# Patient Record
Sex: Female | Born: 2000 | Race: White | Hispanic: No | Marital: Single | Smoking: Never smoker
Health system: Southern US, Community
[De-identification: ages and names within clinical notes are randomized; demographics above are authoritative.]

## PROBLEM LIST (undated history)

## (undated) DIAGNOSIS — G43909 Migraine, unspecified, not intractable, without status migrainosus: Secondary | ICD-10-CM

## (undated) DIAGNOSIS — F329 Major depressive disorder, single episode, unspecified: Secondary | ICD-10-CM

## (undated) DIAGNOSIS — R7303 Prediabetes: Secondary | ICD-10-CM

## (undated) DIAGNOSIS — J45909 Unspecified asthma, uncomplicated: Secondary | ICD-10-CM

## (undated) DIAGNOSIS — E119 Type 2 diabetes mellitus without complications: Secondary | ICD-10-CM

## (undated) DIAGNOSIS — E669 Obesity, unspecified: Secondary | ICD-10-CM

## (undated) DIAGNOSIS — F32A Depression, unspecified: Secondary | ICD-10-CM

## (undated) HISTORY — DX: Major depressive disorder, single episode, unspecified: F32.9

## (undated) HISTORY — PX: NO PAST SURGERIES: SHX2092

## (undated) HISTORY — DX: Depression, unspecified: F32.A

## (undated) HISTORY — DX: Prediabetes: R73.03

---
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
## 2021-01-24 ENCOUNTER — Emergency Department
Admission: EM | Admit: 2021-01-24 | Discharge: 2021-01-24 | Disposition: A | Discharge status: Home / Self Care | Payer: BC Managed Care – PPO | Attending: Emergency Medicine | Admitting: Emergency Medicine

## 2021-01-24 ENCOUNTER — Emergency Department: Payer: BC Managed Care – PPO

## 2021-01-24 ENCOUNTER — Other Ambulatory Visit: Payer: Self-pay

## 2021-01-24 ENCOUNTER — Encounter: Payer: Self-pay | Admitting: Emergency Medicine

## 2021-01-24 DIAGNOSIS — Z7984 Long term (current) use of oral hypoglycemic drugs: Secondary | ICD-10-CM | POA: Insufficient documentation

## 2021-01-24 DIAGNOSIS — E119 Type 2 diabetes mellitus without complications: Secondary | ICD-10-CM | POA: Diagnosis not present

## 2021-01-24 DIAGNOSIS — Z7951 Long term (current) use of inhaled steroids: Secondary | ICD-10-CM | POA: Diagnosis not present

## 2021-01-24 DIAGNOSIS — I10 Essential (primary) hypertension: Secondary | ICD-10-CM | POA: Insufficient documentation

## 2021-01-24 DIAGNOSIS — J4551 Severe persistent asthma with (acute) exacerbation: Secondary | ICD-10-CM | POA: Diagnosis not present

## 2021-01-24 DIAGNOSIS — R531 Weakness: Secondary | ICD-10-CM | POA: Insufficient documentation

## 2021-01-24 DIAGNOSIS — T7840XA Allergy, unspecified, initial encounter: Secondary | ICD-10-CM | POA: Insufficient documentation

## 2021-01-24 LAB — URINALYSIS, COMPLETE (UACMP) WITH MICROSCOPIC
Bacteria, UA: NONE SEEN
Bilirubin Urine: NEGATIVE
Glucose, UA: NEGATIVE mg/dL
Hgb urine dipstick: NEGATIVE
Ketones, ur: NEGATIVE mg/dL
Leukocytes,Ua: NEGATIVE
Nitrite: NEGATIVE
Protein, ur: NEGATIVE mg/dL
Specific Gravity, Urine: 1.019 (ref 1.005–1.030)
pH: 6 (ref 5.0–8.0)

## 2021-01-24 LAB — COMPREHENSIVE METABOLIC PANEL
ALT: 23 U/L (ref 0–44)
AST: 15 U/L (ref 15–41)
Albumin: 3.5 g/dL (ref 3.5–5.0)
Alkaline Phosphatase: 55 U/L (ref 38–126)
Anion gap: 7 (ref 5–15)
BUN: 13 mg/dL (ref 6–20)
CO2: 24 mmol/L (ref 22–32)
Calcium: 9 mg/dL (ref 8.9–10.3)
Chloride: 109 mmol/L (ref 98–111)
Creatinine, Ser: 0.63 mg/dL (ref 0.44–1.00)
GFR, Estimated: >60 mL/min (ref >60)
Glucose, Bld: 85 mg/dL (ref 70–99)
Potassium: 4.2 mmol/L (ref 3.5–5.1)
Sodium: 140 mmol/L (ref 135–145)
Total Bilirubin: 0.5 mg/dL (ref 0.3–1.2)
Total Protein: 7.9 g/dL (ref 6.5–8.1)

## 2021-01-24 LAB — CBC WITH DIFFERENTIAL/PLATELET
Abs Immature Granulocytes: 0.04 10*3/uL (ref 0.00–0.07)
Basophils Absolute: 0 10*3/uL (ref 0.0–0.1)
Basophils Relative: 0 %
Eosinophils Absolute: 0.1 10*3/uL (ref 0.0–0.5)
Eosinophils Relative: 1 %
HCT: 30.8 % — ABNORMAL LOW (ref 36.0–46.0)
Hemoglobin: 9 g/dL — ABNORMAL LOW (ref 12.0–15.0)
Immature Granulocytes: 0 %
Lymphocytes Relative: 21 %
Lymphs Abs: 1.9 10*3/uL (ref 0.7–4.0)
MCH: 24.2 pg — ABNORMAL LOW (ref 26.0–34.0)
MCHC: 29.2 g/dL — ABNORMAL LOW (ref 30.0–36.0)
MCV: 82.8 fL (ref 80.0–100.0)
Monocytes Absolute: 0.6 10*3/uL (ref 0.1–1.0)
Monocytes Relative: 6 %
Neutro Abs: 6.4 10*3/uL (ref 1.7–7.7)
Neutrophils Relative %: 72 %
Platelets: 278 10*3/uL (ref 150–400)
RBC: 3.72 MIL/uL — ABNORMAL LOW (ref 3.87–5.11)
RDW: 16.9 % — ABNORMAL HIGH (ref 11.5–15.5)
WBC: 9 10*3/uL (ref 4.0–10.5)
nRBC: 0 % (ref 0.0–0.2)

## 2021-01-24 MED ORDER — PREDNISONE 50 MG PO TABS: 50.0000 mg | ORAL_TABLET | Freq: Every day | ORAL | 0 refills | Status: DC

## 2021-01-24 MED ORDER — PREDNISONE 20 MG PO TABS
60.0000 mg | ORAL_TABLET | Freq: Once | ORAL | Status: AC
  Administered 2021-01-24: 60 mg via ORAL
  Filled 2021-01-24: qty 3

## 2021-01-24 NOTE — ED Triage Notes (Signed)
Pt comes into the ED via POV c/o allergic reaction. Pt states she ate a [employer] last night and her throat start getting "itchy and swollen".  Pt called EMS who administered an Epi pen.  Pt states this happened at 1:00 this morning.  Pt went to bed and woke up still not feeling well.  Pt denies any swelling of the throat or itchiness now, just states "I don't feel good overall".  Pt denies any CP, SHOB, dizziness, N/V.

## 2021-01-24 NOTE — ED Provider Notes (Signed)
[facility] Emergency Department Provider Note  ____________________________________________  Time seen: Approximately 4:16 PM  I have reviewed the triage vital signs and the nursing notes.   HISTORY  Chief Complaint Allergic Reaction    HPI Sabrina Bell is a 20 y.o. female who presents emergency department for evaluation of "not feeling well" since this morning.  Patient states that last night she was eating a "Hershey kiss" and started having swelling and difficulty breathing/swallowing.  911 was called and EMS presented to evaluate the patient.  The patient reports that she received an EpiPen but did not receive transport evaluation in the emergency department.  Patient states that she felt better after the EpiPen, went to bed when she woke up she has felt "off."  Patient is unable to exactly describe what she means.  No difficulty breathing or swallowing, no rashes, no GI upset, no fevers or chills, nasal congestion, sore throat, cough, abdominal pain.  No urinary symptoms.  Patient does have a history of asthma, diabetes, anemia, hypertension, major depressive disorder, general anxiety.  No additional medications today for her complaint prior to arrival.         Past Medical History:  Diagnosis Date  . Asthma   . Borderline diabetes   . Depression   . Diabetes mellitus without complication ([place])   . Migraines   . Obesity     Patient Active Problem List   Diagnosis Date Noted  . Iron deficiency 05/17/2020  . Chronic iron deficiency anemia 02/03/2020  . Prediabetes 02/03/2020  . Asthma exacerbation 02/03/2020  . 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 Asthma 08/13/2019  . Abnormal abdominal ultrasound 08/13/2019  . Menorrhagia 08/10/2019  . Abdominal pain 08/10/2019  . Status asthmaticus 11/05/2018  . Amenorrhea 11/05/2018  . Social isolation 11/05/2018  . Educational circumstances 11/05/2018  . OSA  (obstructive sleep apnea) 05/27/2017  . Hypersomnia   . Morbid obesity with BMI of 70 and over, adult ([place])   . Essential hypertension   . Acanthosis nigricans, acquired   . Dyspepsia   . Anemia   . Adjustment reaction with anxiety and depression   . Acute respiratory failure with hypoxia ([place])   . Insomnia 11/29/2015  . Generalized anxiety disorder 11/29/2015  . MDD (major depressive disorder), recurrent episode, severe ([place]) 11/28/2015    History reviewed. No pertinent surgical history.  Prior to Admission medications   Medication Sig Start Date End Date Taking? Authorizing Provider  ADVAIR DISKUS 250-50 MCG/DOSE AEPB Inhale 1 puff into the lungs 2 (two) times daily. 03/01/20   [provider]  albuterol (PROVENTIL HFA;VENTOLIN HFA) 108 (90 Base) MCG/ACT inhaler Inhale 1-2 puffs into the lungs every 6 (six) hours as needed for wheezing or shortness of breath. 11/06/18   Wilber Oliphant, MD  cetirizine (ZYRTEC ALLERGY) 10 MG tablet Take 1 tablet (10 mg total) by mouth daily for 5 days. 04/28/20 05/30/20  Rudene Re, MD  EPINEPHrine 0.3 mg/0.3 mL IJ SOAJ injection Inject 0.3 mLs (0.3 mg total) into the muscle as needed for anaphylaxis. 04/28/20   Rudene Re, MD  famotidine (PEPCID) 20 MG tablet Take 1 tablet (20 mg total) by mouth daily for 5 days. 04/28/20 05/03/20  Rudene Re, MD  ferrous sulfate 325 (65 FE) MG tablet Take 1 tablet (325 mg total) by mouth 2 (two) times daily with a meal. 08/11/19 05/30/20  Burnis Medin, MD  fluticasone (FLONASE) 50 MCG/ACT nasal spray Place 2 sprays into both  nostrils daily. Patient not taking: Reported on 09/22/2020 05/30/20   Melynda Ripple, MD  Fluticasone-Salmeterol (ADVAIR DISKUS) 250-50 MCG/DOSE AEPB  03/01/20   [provider]  medroxyPROGESTERone (PROVERA) 10 MG tablet Take 2 tablets (20mg ) twice daily (morning and evening) per day until bleeding stops Then take 2 tablets (20mg ) in the morning and 1 tablet (10mg ) in  the evening for 4 days Then 1 tablet (10mg ) in the morning and 1 tablet (10mg ) in the evening for 4 days Then 1 tablet (10mg ) daily for 4 days Then 1/2 tablet (5mg ) daily for 4 days Then stop 01/25/20   [provider]  metFORMIN (GLUCOPHAGE) 500 MG tablet Take 500 mg by mouth 2 (two) times daily. 12/27/19   [provider]  Spacer/Aero-Holding Chambers (AEROCHAMBER PLUS) inhaler Use as instructed 05/30/20   Melynda Ripple, MD  Thiamine HCl (VITAMIN B-1) 250 MG tablet Take 250 mg by mouth daily. Patient not taking: Reported on 09/22/2020    [provider]  fluticasone (FLOVENT HFA) 110 MCG/ACT inhaler Inhale 2 puffs into the lungs 2 (two) times daily. 11/06/18 05/30/20  Wilber Oliphant, MD    Allergies Cashew nut oil, Omnicef [cefdinir], Other, and Watermelon flavor [flavoring agent]  Family History  Problem Relation Age of Onset  . Diabetes Mother   . Vision loss Mother   . Ovarian cancer Paternal Grandmother   . Prostate cancer Paternal Grandfather     Social History Social History   Tobacco Use  . Smoking status: Never Smoker  . Smokeless tobacco: Never Used  Vaping Use  . Vaping Use: Never used  Substance Use Topics  . Alcohol use: No  . Drug use: No     Review of Systems  Constitutional: No fever/chills.  Positive for generalized malaise.  Treated for allergic reaction yesterday Eyes: No visual changes. No discharge ENT: No upper respiratory complaints. Cardiovascular: no chest pain. Respiratory: no cough. No SOB. Gastrointestinal: No abdominal pain.  No nausea, no vomiting.  No diarrhea.  No constipation. Musculoskeletal: Negative for musculoskeletal pain. Skin: Negative for rash, abrasions, lacerations, ecchymosis. Neurological: Negative for headaches, focal weakness or numbness.  10 System ROS otherwise negative.  ____________________________________________   PHYSICAL EXAM:  VITAL SIGNS: ED Triage Vitals [01/24/21 1501]  Enc Vitals  Group     BP 122/78     Pulse Rate 89     Resp 17     Temp 98.3 F (36.8 C)     Temp Source Oral     SpO2 97 %     Weight (!) 430 lb (195 kg)     Height 5\' 7"  (1.702 m)     Head Circumference      Peak Flow      Pain Score 0     Pain Loc      Pain Edu?      Excl. in [place]?      Constitutional: Alert and oriented. Well appearing and in no acute distress. Eyes: Conjunctivae are normal. PERRL. EOMI. Head: Atraumatic. ENT:      Ears:       Nose: No congestion/rhinnorhea.      Mouth/Throat: Mucous membranes are moist.  No oropharyngeal erythema or edema identified.  Uvula midline. Neck: No stridor.   Hematological/Lymphatic/Immunilogical: No cervical lymphadenopathy. Cardiovascular: Normal rate, regular rhythm. Normal S1 and S2.  Good peripheral circulation. Respiratory: Normal respiratory effort without tachypnea or retractions. Lungs CTAB. Good air entry to the bases with no decreased or absent breath sounds. Gastrointestinal:  Bowel sounds 4 quadrants. Soft and nontender to palpation. No guarding or rigidity. No palpable masses. No distention. No CVA tenderness. Musculoskeletal: Full range of motion to all extremities. No gross deformities appreciated. Neurologic:  Normal speech and language. No gross focal neurologic deficits are appreciated.  Skin:  Skin is warm, dry and intact. No rash noted. Psychiatric: Mood and affect are normal. Speech and behavior are normal. Patient exhibits appropriate insight and judgement.   ____________________________________________   LABS (all labs ordered are listed, but only abnormal results are displayed)  Labs Reviewed  CBC WITH DIFFERENTIAL/PLATELET - Abnormal; Notable for the following components:      Result Value   RBC 3.72 (*)    Hemoglobin 9.0 (*)    HCT 30.8 (*)    MCH 24.2 (*)    MCHC 29.2 (*)    RDW 16.9 (*)    All other components within normal limits  URINALYSIS, COMPLETE (UACMP) WITH MICROSCOPIC - Abnormal; Notable for  the following components:   Color, Urine YELLOW (*)    APPearance CLEAR (*)    All other components within normal limits  COMPREHENSIVE METABOLIC PANEL   ____________________________________________  EKG   ____________________________________________  RADIOLOGY I personally viewed and evaluated these images as part of my medical decision making, as well as reviewing the written report by the radiologist.  ED Provider Interpretation: No acute cardiopulmonary abnormality on x-ray.  DG Chest 2 View  Result Date: 01/24/2021 CLINICAL DATA:  Weakness EXAM: CHEST - 2 VIEW COMPARISON:  None. FINDINGS: The heart size and mediastinal contours are within normal limits. Both lungs are clear. The visualized skeletal structures are unremarkable. IMPRESSION: No active cardiopulmonary disease. Electronically Signed   By: Prudencio Pair M.D.   On: 01/24/2021 17:15    ____________________________________________    PROCEDURES  Procedure(s) performed:    Procedures    Medications  predniSONE (DELTASONE) tablet 60 mg (has no administration in time range)     ____________________________________________   INITIAL IMPRESSION / ASSESSMENT AND PLAN / ED COURSE  Pertinent labs & imaging results that were available during my care of the patient were reviewed by me and considered in my medical decision making (see chart for details).  Review of the [place] CSRS was performed in accordance of the [place] prior to dispensing any controlled drugs.           Patient's diagnosis is consistent with weakness.  Patient presented to the emergency department complaining of not feeling well.  Patient had reported allergic reaction last night.  EMS was called and patient reportedly received an EpiPen but was not transported or assessed in the emergency department.  Patient states that she woke up feeling "off."  No acute findings on physical exam.  Labs, chest x-ray, urine is reassuring at this time.  I will  treat the patient with prednisone secondary to her allergic reaction requiring epi earlier today, however no other acute findings requiring intervention currently.  If patient develops further symptoms and is able to better qualify what is wrong, she can always return to the emergency department for evaluation.  Return precautions discussed at length with the patient.  Follow-up primary care. Patient is given ED precautions to return to the ED for any worsening or new symptoms.     ____________________________________________  FINAL CLINICAL IMPRESSION(S) / ED DIAGNOSES  Final diagnoses:  Weakness      NEW MEDICATIONS STARTED DURING THIS VISIT:  ED Discharge Orders    None  This chart was dictated using voice recognition software/Dragon. Despite best efforts to proofread, errors can occur which can change the meaning. Any change was purely unintentional.    Darletta Moll, PA-C 01/24/21 2120    Lucrezia Starch, MD 01/24/21 [phone]

## 2021-01-24 NOTE — ED Notes (Signed)
Pt calm, collective, denies pain or sob

## 2021-01-26 ENCOUNTER — Other Ambulatory Visit: Payer: Self-pay

## 2021-01-26 DIAGNOSIS — H5316 Psychophysical visual disturbances: Secondary | ICD-10-CM | POA: Diagnosis not present

## 2021-01-26 DIAGNOSIS — E1169 Type 2 diabetes mellitus with other specified complication: Secondary | ICD-10-CM | POA: Diagnosis not present

## 2021-01-26 DIAGNOSIS — Z7951 Long term (current) use of inhaled steroids: Secondary | ICD-10-CM | POA: Insufficient documentation

## 2021-01-26 DIAGNOSIS — Z7984 Long term (current) use of oral hypoglycemic drugs: Secondary | ICD-10-CM | POA: Diagnosis not present

## 2021-01-26 DIAGNOSIS — I10 Essential (primary) hypertension: Secondary | ICD-10-CM | POA: Insufficient documentation

## 2021-01-26 DIAGNOSIS — J45909 Unspecified asthma, uncomplicated: Secondary | ICD-10-CM | POA: Insufficient documentation

## 2021-01-26 DIAGNOSIS — R5381 Other malaise: Secondary | ICD-10-CM | POA: Diagnosis not present

## 2021-01-26 DIAGNOSIS — G2402 Drug induced acute dystonia: Secondary | ICD-10-CM | POA: Insufficient documentation

## 2021-01-26 DIAGNOSIS — R253 Fasciculation: Secondary | ICD-10-CM | POA: Diagnosis not present

## 2021-01-26 NOTE — ED Triage Notes (Signed)
Patient presents with EMS reporting acute onset of R sided twitching to neck. Hx of anxiety. Denies pain or feeling anxious. Pt calm and cooperative in triage but twitching is noticed. Pt denies head or neurological trauma. Denies hx of seizures. Denies changes to medications or diet. Breathing unlabored speaking in full sentences.

## 2021-01-27 ENCOUNTER — Emergency Department
Admission: EM | Admit: 2021-01-27 | Discharge: 2021-01-27 | Disposition: A | Discharge status: Home / Self Care | Payer: BC Managed Care – PPO | Attending: Emergency Medicine | Admitting: Emergency Medicine

## 2021-01-27 DIAGNOSIS — G2402 Drug induced acute dystonia: Secondary | ICD-10-CM

## 2021-01-27 LAB — CBC WITH DIFFERENTIAL/PLATELET
Abs Immature Granulocytes: 0.04 10*3/uL (ref 0.00–0.07)
Basophils Absolute: 0.1 10*3/uL (ref 0.0–0.1)
Basophils Relative: 1 %
Eosinophils Absolute: 0 10*3/uL (ref 0.0–0.5)
Eosinophils Relative: 0 %
HCT: 33 % — ABNORMAL LOW (ref 36.0–46.0)
Hemoglobin: 10 g/dL — ABNORMAL LOW (ref 12.0–15.0)
Immature Granulocytes: 0 %
Lymphocytes Relative: 28 %
Lymphs Abs: 3.7 10*3/uL (ref 0.7–4.0)
MCH: 24.8 pg — ABNORMAL LOW (ref 26.0–34.0)
MCHC: 30.3 g/dL (ref 30.0–36.0)
MCV: 81.9 fL (ref 80.0–100.0)
Monocytes Absolute: 0.8 10*3/uL (ref 0.1–1.0)
Monocytes Relative: 6 %
Neutro Abs: 8.4 10*3/uL — ABNORMAL HIGH (ref 1.7–7.7)
Neutrophils Relative %: 65 %
Platelets: 298 10*3/uL (ref 150–400)
RBC: 4.03 MIL/uL (ref 3.87–5.11)
RDW: 16.6 % — ABNORMAL HIGH (ref 11.5–15.5)
WBC: 13 10*3/uL — ABNORMAL HIGH (ref 4.0–10.5)
nRBC: 0 % (ref 0.0–0.2)

## 2021-01-27 LAB — COMPREHENSIVE METABOLIC PANEL
ALT: 23 U/L (ref 0–44)
AST: 18 U/L (ref 15–41)
Albumin: 3.8 g/dL (ref 3.5–5.0)
Alkaline Phosphatase: 50 U/L (ref 38–126)
Anion gap: 10 (ref 5–15)
BUN: 22 mg/dL — ABNORMAL HIGH (ref 6–20)
CO2: 23 mmol/L (ref 22–32)
Calcium: 9.4 mg/dL (ref 8.9–10.3)
Chloride: 106 mmol/L (ref 98–111)
Creatinine, Ser: 0.98 mg/dL (ref 0.44–1.00)
GFR, Estimated: >60 mL/min (ref >60)
Glucose, Bld: 94 mg/dL (ref 70–99)
Potassium: 3.8 mmol/L (ref 3.5–5.1)
Sodium: 139 mmol/L (ref 135–145)
Total Bilirubin: 0.6 mg/dL (ref 0.3–1.2)
Total Protein: 8.4 g/dL — ABNORMAL HIGH (ref 6.5–8.1)

## 2021-01-27 LAB — MAGNESIUM: Magnesium: 1.7 mg/dL (ref 1.7–2.4)

## 2021-01-27 LAB — CK: Total CK: 66 U/L (ref 38–234)

## 2021-01-27 MED ORDER — DIPHENHYDRAMINE HCL 50 MG/ML IJ SOLN
25.0000 mg | Freq: Once | INTRAMUSCULAR | Status: AC
  Administered 2021-01-27: 25 mg via INTRAVENOUS
  Filled 2021-01-27: qty 1

## 2021-01-27 MED ORDER — SODIUM CHLORIDE 0.9 % IV BOLUS
500.0000 mL | Freq: Once | INTRAVENOUS | Status: AC
  Administered 2021-01-27: 500 mL via INTRAVENOUS

## 2021-01-27 NOTE — Discharge Instructions (Addendum)
Discontinue Prednisone.  Talk to your doctor if you should continue Prozac.  Take Benadryl as needed for muscle twitching.  Return to the ER for worsening symptoms, persistent vomiting, difficulty breathing or other concerns.

## 2021-01-27 NOTE — ED Provider Notes (Signed)
[facility] Emergency Department Provider Note   ____________________________________________   Event Date/Time   First MD Initiated Contact with Patient 01/27/21 [phone]     (approximate)  I have reviewed the triage vital signs and the nursing notes.   HISTORY  Chief Complaint muscle twitching    HPI Sabrina Bell is a 20 y.o. female brought to the ED via EMS from home with a chief complaint of neck twitching.  Patient has a history of depression, on Prozac.  Recently seen in the ED 3 days ago after allergic reaction and placed on prednisone.  Reports involuntary neck twitching on and off since yesterday.  Denies pain.  Denies headache, vision changes, neck pain, chest pain, shortness of breath, abdominal pain, nausea, vomiting, dizziness, extremity weakness/numbness/tingling.  Denies injury, fall or trauma. Other than the newly taken prednisone, patient denies medication adjustments.     Past Medical History:  Diagnosis Date  . Asthma   . Borderline diabetes   . Depression   . Diabetes mellitus without complication ([place])   . Migraines   . Obesity     Patient Active Problem List   Diagnosis Date Noted  . Iron deficiency 05/17/2020  . Chronic iron deficiency anemia 02/03/2020  . Prediabetes 02/03/2020  . Asthma exacerbation 02/03/2020  . 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 Asthma 08/13/2019  . Abnormal abdominal ultrasound 08/13/2019  . Menorrhagia 08/10/2019  . Abdominal pain 08/10/2019  . Status asthmaticus 11/05/2018  . Amenorrhea 11/05/2018  . Social isolation 11/05/2018  . Educational circumstances 11/05/2018  . OSA (obstructive sleep apnea) 05/27/2017  . Hypersomnia   . Morbid obesity with BMI of 70 and over, adult ([place])   . Essential hypertension   . Acanthosis nigricans, acquired   . Dyspepsia   . Anemia   . Adjustment reaction with anxiety and depression   . Acute respiratory failure with  hypoxia ([place])   . Insomnia 11/29/2015  . Generalized anxiety disorder 11/29/2015  . MDD (major depressive disorder), recurrent episode, severe ([place]) 11/28/2015    History reviewed. No pertinent surgical history.  Prior to Admission medications   Medication Sig Start Date End Date Taking? Authorizing Provider  ADVAIR DISKUS 250-50 MCG/DOSE AEPB Inhale 1 puff into the lungs 2 (two) times daily. 03/01/20   [provider]  albuterol (PROVENTIL HFA;VENTOLIN HFA) 108 (90 Base) MCG/ACT inhaler Inhale 1-2 puffs into the lungs every 6 (six) hours as needed for wheezing or shortness of breath. 11/06/18   Wilber Oliphant, MD  cetirizine (ZYRTEC ALLERGY) 10 MG tablet Take 1 tablet (10 mg total) by mouth daily for 5 days. 04/28/20 05/30/20  Rudene Re, MD  EPINEPHrine 0.3 mg/0.3 mL IJ SOAJ injection Inject 0.3 mLs (0.3 mg total) into the muscle as needed for anaphylaxis. 04/28/20   Rudene Re, MD  famotidine (PEPCID) 20 MG tablet Take 1 tablet (20 mg total) by mouth daily for 5 days. 04/28/20 05/03/20  Rudene Re, MD  ferrous sulfate 325 (65 FE) MG tablet Take 1 tablet (325 mg total) by mouth 2 (two) times daily with a meal. 08/11/19 05/30/20  Burnis Medin, MD  fluticasone (FLONASE) 50 MCG/ACT nasal spray Place 2 sprays into both nostrils daily. Patient not taking: Reported on 09/22/2020 05/30/20   Melynda Ripple, MD  Fluticasone-Salmeterol (ADVAIR DISKUS) 250-50 MCG/DOSE AEPB  03/01/20   [provider]  medroxyPROGESTERone (PROVERA) 10 MG tablet Take 2 tablets (53m) twice daily (morning and evening) per day until bleeding  stops Then take 2 tablets (27m) in the morning and 1 tablet (136m in the evening for 4 days Then 1 tablet (1029min the morning and 1 tablet (62m13mn the evening for 4 days Then 1 tablet (62mg64mily for 4 days Then 1/2 tablet (5mg) 66mly for 4 days Then stop 01/25/20   [provider]  metFORMIN (GLUCOPHAGE) 500 MG tablet Take 500 mg by mouth 2  (two) times daily. 12/27/19   [provider]  predniSONE (DELTASONE) 50 MG tablet Take 1 tablet (50 mg total) by mouth daily with breakfast. 01/24/21   Cuthriell, JonathCharline Bills  Spacer/Aero-Holding Chambers (AEROCHAMBER PLUS) inhaler Use as instructed 05/30/20   MortenMelynda RippleThiamine HCl (VITAMIN B-1) 250 MG tablet Take 250 mg by mouth daily. Patient not taking: Reported on 09/22/2020    [provider]  fluticasone (FLOVENT HFA) 110 MCG/ACT inhaler Inhale 2 puffs into the lungs 2 (two) times daily. 11/06/18 05/30/20  Kim, RWilber Oliphant  Allergies Cashew nut oil, Omnicef [cefdinir], Other, and Watermelon flavor [flavoring agent]  Family History  Problem Relation Age of Onset  . Diabetes Mother   . Vision loss Mother   . Ovarian cancer Paternal Grandmother   . Prostate cancer Paternal Grandfather     Social History Social History   Tobacco Use  . Smoking status: Never Smoker  . Smokeless tobacco: Never Used  Vaping Use  . Vaping Use: Never used  Substance Use Topics  . Alcohol use: No  . Drug use: No    Review of Systems  Constitutional: No fever/chills Eyes: No visual changes. ENT: No sore throat. Cardiovascular: Denies chest pain. Respiratory: Denies shortness of breath. Gastrointestinal: No abdominal pain.  No nausea, no vomiting.  No diarrhea.  No constipation. Genitourinary: Negative for dysuria. Musculoskeletal: Positive for neck twitching.  Negative for back pain. Skin: Negative for rash. Neurological: Negative for headaches, focal weakness or numbness.   ____________________________________________   PHYSICAL EXAM:  VITAL SIGNS: ED Triage Vitals  Enc Vitals Group     BP 01/26/21 2353 (!) 141/75     Pulse Rate 01/26/21 2353 84     Resp 01/26/21 2353 18     Temp 01/26/21 2353 98.1 F (36.7 C)     Temp Source 01/26/21 2353 Oral     SpO2 01/26/21 2353 100 %     Weight 01/26/21 2352 (!) 430 lb (195 kg)     Height 01/26/21 2352  _0  (1.702 m)     Head Circumference --      Peak Flow --      Pain Score 01/26/21 2352 0     Pain Loc --      Pain Edu? --      Excl. in [place]? --[place]   Constitutional: Alert and oriented. Well appearing and in mild acute distress. Eyes: Conjunctivae are normal. PERRL. EOMI. Head: Atraumatic. Nose: No congestion/rhinnorhea. Mouth/Throat: Mucous membranes are moist.   Neck: No stridor.  No cervical spine tenderness to palpation.  Involuntary neck movements. Cardiovascular: Normal rate, regular rhythm. Grossly normal heart sounds.  Good peripheral circulation. Respiratory: Normal respiratory effort.  No retractions. Lungs CTAB. Gastrointestinal: Soft and nontender. No distention. No abdominal bruits. No CVA tenderness. Musculoskeletal: No lower extremity tenderness nor edema.  No joint effusions. Neurologic:  Normal speech and language. No gross focal neurologic deficits are appreciated. No gait instability. Skin:  Skin is warm, dry and intact. No rash noted. Psychiatric: Mood  and affect are normal. Speech and behavior are normal.  ____________________________________________   LABS (all labs ordered are listed, but only abnormal results are displayed)  Labs Reviewed  CBC WITH DIFFERENTIAL/PLATELET - Abnormal; Notable for the following components:      Result Value   WBC 13.0 (*)    Hemoglobin 10.0 (*)    HCT 33.0 (*)    MCH 24.8 (*)    RDW 16.6 (*)    Neutro Abs 8.4 (*)    All other components within normal limits  COMPREHENSIVE METABOLIC PANEL - Abnormal; Notable for the following components:   BUN 22 (*)    Total Protein 8.4 (*)    All other components within normal limits  CK  MAGNESIUM   ____________________________________________  EKG  None ____________________________________________  RADIOLOGY I, Hearl Heikes J, personally viewed and evaluated these images (plain radiographs) as part of my medical decision making, as well as reviewing the written report by the  radiologist.  ED MD interpretation: None  Official radiology report(s): No results found.  ____________________________________________   PROCEDURES  Procedure(s) performed (including Critical Care):  Procedures   ____________________________________________   INITIAL IMPRESSION / ASSESSMENT AND PLAN / ED COURSE  As part of my medical decision making, I reviewed the following data within the [facility] notes reviewed and incorporated, Labs reviewed, Old chart reviewed and Notes from prior ED visits     20 year old female who presents with involuntary neck twitching.  Differential diagnosis includes but is not limited to dystonic reaction, torticollis, rhabdomyolysis, metabolic etiology, etc.  CBC and met be unremarkable other than mild leukocytosis.  Has been on Prednisone.  Will check magnesium, CK.  Initiate IV fluids, IV Benadryl for dystonia.  Will reassess.  Clinical Course as of 01/27/21 0729  Sat Jan 27, 2021  0438 Patient feeling better; overall dystonic reaction has improved.  Slight involuntary movement remains.  Will redose IV Benadryl. [JS]  [id number] Patient asleep, no muscle twitching noted.  Awakened her to reassess.  Advised her to discontinue prednisone.  Strict return precautions given.  Patient verbalizes understanding agrees with plan of care. [JS]    Clinical Course User Index [JS] Paulette Blanch, MD     ____________________________________________   FINAL CLINICAL IMPRESSION(S) / ED DIAGNOSES  Final diagnoses:  Dystonic drug reaction     ED Discharge Orders    None      *Please note:  Sabrina Bell was evaluated in Emergency Department on 01/27/2021 for the symptoms described in the history of present illness. She was evaluated in the context of the global COVID-19 pandemic, which necessitated consideration that the patient might be at risk for infection with the SARS-CoV-2 virus that causes COVID-19. Institutional  protocols and algorithms that pertain to the evaluation of patients at risk for COVID-19 are in a state of rapid change based on information released by regulatory bodies including the CDC and federal and state organizations. These policies and algorithms were followed during the patient's care in the ED.  Some ED evaluations and interventions may be delayed as a result of limited staffing during and the pandemic.*   Note:  This document was prepared using Dragon voice recognition software and may include unintentional dictation errors.   Paulette Blanch, MD 01/27/21 [phone]

## 2021-01-27 NOTE — ED Notes (Signed)
Pt presents with L sided twitch of neck. Pt states she started prednisone earlier this week for an allergic reaction. Pt denies pain or any other complaints at this time. Pt able to speak in full sentences.

## 2021-01-28 ENCOUNTER — Emergency Department: Payer: BC Managed Care – PPO

## 2021-01-28 ENCOUNTER — Emergency Department
Admission: EM | Admit: 2021-01-28 | Discharge: 2021-01-28 | Disposition: A | Discharge status: Home / Self Care | Payer: BC Managed Care – PPO | Attending: Emergency Medicine | Admitting: Emergency Medicine

## 2021-01-28 ENCOUNTER — Encounter: Payer: Self-pay | Admitting: Emergency Medicine

## 2021-01-28 ENCOUNTER — Other Ambulatory Visit: Payer: Self-pay

## 2021-01-28 DIAGNOSIS — I10 Essential (primary) hypertension: Secondary | ICD-10-CM | POA: Insufficient documentation

## 2021-01-28 DIAGNOSIS — E119 Type 2 diabetes mellitus without complications: Secondary | ICD-10-CM | POA: Insufficient documentation

## 2021-01-28 DIAGNOSIS — Z7951 Long term (current) use of inhaled steroids: Secondary | ICD-10-CM | POA: Diagnosis not present

## 2021-01-28 DIAGNOSIS — R202 Paresthesia of skin: Secondary | ICD-10-CM | POA: Insufficient documentation

## 2021-01-28 DIAGNOSIS — R0789 Other chest pain: Secondary | ICD-10-CM

## 2021-01-28 DIAGNOSIS — R200 Anesthesia of skin: Secondary | ICD-10-CM | POA: Diagnosis not present

## 2021-01-28 DIAGNOSIS — Z7984 Long term (current) use of oral hypoglycemic drugs: Secondary | ICD-10-CM | POA: Diagnosis not present

## 2021-01-28 DIAGNOSIS — J4551 Severe persistent asthma with (acute) exacerbation: Secondary | ICD-10-CM | POA: Insufficient documentation

## 2021-01-28 LAB — TROPONIN I (HIGH SENSITIVITY): Troponin I (High Sensitivity): <2 ng/L (ref <18)

## 2021-01-28 NOTE — ED Triage Notes (Signed)
Presents via ems for left arm numbness and tingling. pt states started after taking her steroid medication. Pt also endorses chest pain 5/10 in center chest. Pt  Was seen here at [facility] few days ago for an allergic reaction. Pt in no acute distress at this time respirations even and unlabored.

## 2021-01-28 NOTE — ED Provider Notes (Signed)
[facility] Emergency Department Provider Note   ____________________________________________    I have reviewed the triage vital signs and the nursing notes.   HISTORY  Chief Complaint Numbness     HPI Sabrina Bell is a 20 y.o. female with a history as noted below who reports that since she started taking prednisone she has had chest discomfort and today she had tingling in her left arm.  Notably the patient was seen last night for presumed prednisone side effect, was told to stop taking it but she did take a dose this morning because it was her last day's pill.  She denies chest pain at this time.  No shortness of breath.  No cough fevers or chills.  No calf pain or swelling.  No pleurisy.  Past Medical History:  Diagnosis Date  . Asthma   . Borderline diabetes   . Depression   . Diabetes mellitus without complication ([place])   . Migraines   . Obesity     Patient Active Problem List   Diagnosis Date Noted  . Iron deficiency 05/17/2020  . Chronic iron deficiency anemia 02/03/2020  . Prediabetes 02/03/2020  . Asthma exacerbation 02/03/2020  . 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 Asthma 08/13/2019  . Abnormal abdominal ultrasound 08/13/2019  . Menorrhagia 08/10/2019  . Abdominal pain 08/10/2019  . Status asthmaticus 11/05/2018  . Amenorrhea 11/05/2018  . Social isolation 11/05/2018  . Educational circumstances 11/05/2018  . OSA (obstructive sleep apnea) 05/27/2017  . Hypersomnia   . Morbid obesity with BMI of 70 and over, adult ([place])   . Essential hypertension   . Acanthosis nigricans, acquired   . Dyspepsia   . Anemia   . Adjustment reaction with anxiety and depression   . Acute respiratory failure with hypoxia ([place])   . Insomnia 11/29/2015  . Generalized anxiety disorder 11/29/2015  . MDD (major depressive disorder), recurrent episode, severe ([place]) 11/28/2015    History reviewed. No pertinent  surgical history.  Prior to Admission medications   Medication Sig Start Date End Date Taking? Authorizing Provider  ADVAIR DISKUS 250-50 MCG/DOSE AEPB Inhale 1 puff into the lungs 2 (two) times daily. 03/01/20   [provider]  albuterol (PROVENTIL HFA;VENTOLIN HFA) 108 (90 Base) MCG/ACT inhaler Inhale 1-2 puffs into the lungs every 6 (six) hours as needed for wheezing or shortness of breath. 11/06/18   Wilber Oliphant, MD  cetirizine (ZYRTEC ALLERGY) 10 MG tablet Take 1 tablet (10 mg total) by mouth daily for 5 days. 04/28/20 05/30/20  Rudene Re, MD  EPINEPHrine 0.3 mg/0.3 mL IJ SOAJ injection Inject 0.3 mLs (0.3 mg total) into the muscle as needed for anaphylaxis. 04/28/20   Rudene Re, MD  famotidine (PEPCID) 20 MG tablet Take 1 tablet (20 mg total) by mouth daily for 5 days. 04/28/20 05/03/20  Rudene Re, MD  ferrous sulfate 325 (65 FE) MG tablet Take 1 tablet (325 mg total) by mouth 2 (two) times daily with a meal. 08/11/19 05/30/20  Burnis Medin, MD  fluticasone (FLONASE) 50 MCG/ACT nasal spray Place 2 sprays into both nostrils daily. Patient not taking: Reported on 09/22/2020 05/30/20   Melynda Ripple, MD  Fluticasone-Salmeterol (ADVAIR DISKUS) 250-50 MCG/DOSE AEPB  03/01/20   [provider]  medroxyPROGESTERone (PROVERA) 10 MG tablet Take 2 tablets (20mg ) twice daily (morning and evening) per day until bleeding stops Then take 2 tablets (20mg ) in the morning and 1 tablet (10mg ) in the evening for  4 days Then 1 tablet (10mg ) in the morning and 1 tablet (10mg ) in the evening for 4 days Then 1 tablet (10mg ) daily for 4 days Then 1/2 tablet (5mg ) daily for 4 days Then stop 01/25/20   [provider]  metFORMIN (GLUCOPHAGE) 500 MG tablet Take 500 mg by mouth 2 (two) times daily. 12/27/19   [provider]  predniSONE (DELTASONE) 50 MG tablet Take 1 tablet (50 mg total) by mouth daily with breakfast. 01/24/21   Cuthriell, Charline Bills, PA-C   Spacer/Aero-Holding Chambers (AEROCHAMBER PLUS) inhaler Use as instructed 05/30/20   Melynda Ripple, MD  Thiamine HCl (VITAMIN B-1) 250 MG tablet Take 250 mg by mouth daily. Patient not taking: Reported on 09/22/2020    [provider]  fluticasone (FLOVENT HFA) 110 MCG/ACT inhaler Inhale 2 puffs into the lungs 2 (two) times daily. 11/06/18 05/30/20  Wilber Oliphant, MD     Allergies Cashew nut oil, Omnicef [cefdinir], Other, and Watermelon flavor [flavoring agent]  Family History  Problem Relation Age of Onset  . Diabetes Mother   . Vision loss Mother   . Ovarian cancer Paternal Grandmother   . Prostate cancer Paternal Grandfather     Social History Social History   Tobacco Use  . Smoking status: Never Smoker  . Smokeless tobacco: Never Used  Vaping Use  . Vaping Use: Never used  Substance Use Topics  . Alcohol use: No  . Drug use: No    Review of Systems  Constitutional: No fever/chills Eyes: No visual changes.  ENT: No sore throat. Cardiovascular: As above Respiratory: Denies shortness of breath. Gastrointestinal: No abdominal pain.  No nausea, no vomiting.   Genitourinary: Negative for dysuria. Musculoskeletal: Negative for back pain. Skin: Negative for rash. Neurological: As above, no weakness   ____________________________________________   PHYSICAL EXAM:  VITAL SIGNS: ED Triage Vitals  Enc Vitals Group     BP 01/28/21 1547 (!) 129/91     Pulse Rate 01/28/21 1547 72     Resp 01/28/21 1631 14     Temp 01/28/21 1547 98.1 F (36.7 C)     Temp Source 01/28/21 1547 Oral     SpO2 01/28/21 1547 99 %     Weight 01/28/21 1544 (!) 195 kg (429 lb 14.4 oz)     Height 01/28/21 1544 1.702 m (5\' 7" )     Head Circumference --      Peak Flow --      Pain Score 01/28/21 1544 5     Pain Loc --      Pain Edu? --      Excl. in [place]? --     Constitutional: Alert and oriented. Eyes: Conjunctivae are normal.   Nose: No congestion/rhinnorhea. Mouth/Throat:  Mucous membranes are moist.    Cardiovascular: Normal rate, regular rhythm. Grossly normal heart sounds.  Good peripheral circulation. Respiratory: Normal respiratory effort.  No retractions. Lungs CTAB. Gastrointestinal: Soft and nontender. No distention.  No CVA tenderness.  Musculoskeletal:   Warm and well perfused Neurologic:  Normal speech and language. No gross focal neurologic deficits are appreciated.  Skin:  Skin is warm, dry and intact. No rash noted. Psychiatric: Mood and affect are normal. Speech and behavior are normal.  ____________________________________________   LABS (all labs ordered are listed, but only abnormal results are displayed)  Labs Reviewed  TROPONIN I (HIGH SENSITIVITY)   ____________________________________________  EKG  ED ECG REPORT I, Lavonia Drafts, the attending physician, personally viewed and interpreted this ECG.  Date: 01/28/2021  Rhythm: normal sinus rhythm QRS Axis: normal Intervals: normal ST/T Wave abnormalities: normal Narrative Interpretation: no evidence of acute ischemia  ____________________________________________  RADIOLOGY  Chest x-ray read by me, no abnormality ____________________________________________   PROCEDURES  Procedure(s) performed: No  Procedures   Critical Care performed: No ____________________________________________   INITIAL IMPRESSION / ASSESSMENT AND PLAN / ED COURSE  Pertinent labs & imaging results that were available during my care of the patient were reviewed by me and considered in my medical decision making (see chart for details).  Patient well-appearing and in no acute distress, essentially asymptomatic at this time.  Very low risk for ACS, myocarditis, PE.  No shortness of breath or pleurisy.  PERC negative.  eKG is normal, chest x-ray unremarkable, high sensitive troponin undetectable  Recommend close follow-up with cardiology as needed, return precautions discussed, DC  prednisone    ____________________________________________   FINAL CLINICAL IMPRESSION(S) / ED DIAGNOSES  Final diagnoses:  Atypical chest pain        Note:  This document was prepared using Dragon voice recognition software and may include unintentional dictation errors.   Lavonia Drafts, MD 01/28/21 Pauline Aus

## 2021-01-31 DIAGNOSIS — F419 Anxiety disorder, unspecified: Secondary | ICD-10-CM | POA: Diagnosis not present

## 2021-01-31 DIAGNOSIS — R202 Paresthesia of skin: Secondary | ICD-10-CM | POA: Diagnosis not present

## 2021-01-31 DIAGNOSIS — R200 Anesthesia of skin: Secondary | ICD-10-CM | POA: Diagnosis not present

## 2021-01-31 DIAGNOSIS — R079 Chest pain, unspecified: Secondary | ICD-10-CM | POA: Diagnosis not present

## 2021-01-31 DIAGNOSIS — R0781 Pleurodynia: Secondary | ICD-10-CM | POA: Diagnosis not present

## 2021-01-31 DIAGNOSIS — R519 Headache, unspecified: Secondary | ICD-10-CM | POA: Diagnosis not present

## 2021-01-31 DIAGNOSIS — F329 Major depressive disorder, single episode, unspecified: Secondary | ICD-10-CM | POA: Diagnosis not present

## 2021-01-31 DIAGNOSIS — R531 Weakness: Secondary | ICD-10-CM | POA: Diagnosis not present

## 2021-01-31 DIAGNOSIS — Z79899 Other long term (current) drug therapy: Secondary | ICD-10-CM | POA: Diagnosis not present

## 2021-01-31 DIAGNOSIS — Z881 Allergy status to other antibiotic agents status: Secondary | ICD-10-CM | POA: Diagnosis not present

## 2021-01-31 DIAGNOSIS — J45909 Unspecified asthma, uncomplicated: Secondary | ICD-10-CM | POA: Diagnosis not present

## 2021-02-01 DIAGNOSIS — R519 Headache, unspecified: Secondary | ICD-10-CM | POA: Diagnosis not present

## 2021-02-01 DIAGNOSIS — R079 Chest pain, unspecified: Secondary | ICD-10-CM | POA: Diagnosis not present

## 2021-02-02 ENCOUNTER — Ambulatory Visit: Payer: BC Managed Care – PPO | Admitting: Cardiology

## 2021-02-05 ENCOUNTER — Encounter: Payer: Self-pay | Admitting: Cardiology

## 2021-03-13 ENCOUNTER — Telehealth: Payer: Self-pay

## 2021-03-13 ENCOUNTER — Other Ambulatory Visit: Payer: Self-pay | Admitting: Obstetrics and Gynecology

## 2021-03-13 MED ORDER — MEDROXYPROGESTERONE ACETATE 10 MG PO TABS: 10.0000 mg | ORAL_TABLET | Freq: Every day | ORAL | 11 refills | Status: DC

## 2021-03-13 NOTE — Telephone Encounter (Signed)
Pt calling for refill of med she needs.  [phone]  Pt states she needs provera so she doesn't bleed; [facility].

## 2021-03-13 NOTE — Telephone Encounter (Signed)
Rx sent 

## 2021-03-13 NOTE — Telephone Encounter (Signed)
Pt aware.

## 2021-05-23 ENCOUNTER — Encounter: Payer: Self-pay | Admitting: Emergency Medicine

## 2021-05-23 ENCOUNTER — Emergency Department
Admission: EM | Admit: 2021-05-23 | Discharge: 2021-05-23 | Disposition: A | Discharge status: Home / Self Care | Payer: BC Managed Care – PPO | Attending: Emergency Medicine | Admitting: Emergency Medicine

## 2021-05-23 ENCOUNTER — Other Ambulatory Visit: Payer: Self-pay

## 2021-05-23 DIAGNOSIS — R109 Unspecified abdominal pain: Secondary | ICD-10-CM | POA: Diagnosis not present

## 2021-05-23 DIAGNOSIS — R110 Nausea: Secondary | ICD-10-CM | POA: Insufficient documentation

## 2021-05-23 DIAGNOSIS — E119 Type 2 diabetes mellitus without complications: Secondary | ICD-10-CM | POA: Insufficient documentation

## 2021-05-23 DIAGNOSIS — Z7984 Long term (current) use of oral hypoglycemic drugs: Secondary | ICD-10-CM | POA: Diagnosis not present

## 2021-05-23 DIAGNOSIS — Z7951 Long term (current) use of inhaled steroids: Secondary | ICD-10-CM | POA: Diagnosis not present

## 2021-05-23 DIAGNOSIS — J4551 Severe persistent asthma with (acute) exacerbation: Secondary | ICD-10-CM | POA: Diagnosis not present

## 2021-05-23 DIAGNOSIS — I10 Essential (primary) hypertension: Secondary | ICD-10-CM | POA: Insufficient documentation

## 2021-05-23 DIAGNOSIS — R197 Diarrhea, unspecified: Secondary | ICD-10-CM | POA: Insufficient documentation

## 2021-05-23 LAB — BASIC METABOLIC PANEL
Anion gap: 7 (ref 5–15)
BUN: 12 mg/dL (ref 6–20)
CO2: 24 mmol/L (ref 22–32)
Calcium: 9.3 mg/dL (ref 8.9–10.3)
Chloride: 106 mmol/L (ref 98–111)
Creatinine, Ser: 0.67 mg/dL (ref 0.44–1.00)
GFR, Estimated: >60 mL/min (ref >60)
Glucose, Bld: 122 mg/dL — ABNORMAL HIGH (ref 70–99)
Potassium: 4 mmol/L (ref 3.5–5.1)
Sodium: 137 mmol/L (ref 135–145)

## 2021-05-23 LAB — URINALYSIS, COMPLETE (UACMP) WITH MICROSCOPIC
Bilirubin Urine: NEGATIVE
Glucose, UA: NEGATIVE mg/dL
Hgb urine dipstick: NEGATIVE
Ketones, ur: NEGATIVE mg/dL
Leukocytes,Ua: NEGATIVE
Nitrite: NEGATIVE
Protein, ur: NEGATIVE mg/dL
Specific Gravity, Urine: 1.025 (ref 1.005–1.030)
pH: 5 (ref 5.0–8.0)

## 2021-05-23 LAB — CBC WITH DIFFERENTIAL/PLATELET
Abs Immature Granulocytes: 0.04 10*3/uL (ref 0.00–0.07)
Basophils Absolute: 0.1 10*3/uL (ref 0.0–0.1)
Basophils Relative: 1 %
Eosinophils Absolute: 0.1 10*3/uL (ref 0.0–0.5)
Eosinophils Relative: 1 %
HCT: 33.3 % — ABNORMAL LOW (ref 36.0–46.0)
Hemoglobin: 10.1 g/dL — ABNORMAL LOW (ref 12.0–15.0)
Immature Granulocytes: 0 %
Lymphocytes Relative: 23 %
Lymphs Abs: 2.2 10*3/uL (ref 0.7–4.0)
MCH: 25.3 pg — ABNORMAL LOW (ref 26.0–34.0)
MCHC: 30.3 g/dL (ref 30.0–36.0)
MCV: 83.5 fL (ref 80.0–100.0)
Monocytes Absolute: 0.4 10*3/uL (ref 0.1–1.0)
Monocytes Relative: 4 %
Neutro Abs: 6.8 10*3/uL (ref 1.7–7.7)
Neutrophils Relative %: 71 %
Platelets: 328 10*3/uL (ref 150–400)
RBC: 3.99 MIL/uL (ref 3.87–5.11)
RDW: 15.6 % — ABNORMAL HIGH (ref 11.5–15.5)
WBC: 9.5 10*3/uL (ref 4.0–10.5)
nRBC: 0 % (ref 0.0–0.2)

## 2021-05-23 LAB — POC URINE PREG, ED: Preg Test, Ur: NEGATIVE

## 2021-05-23 MED ORDER — ONDANSETRON 4 MG PO TBDP: 4.0000 mg | ORAL_TABLET | Freq: Three times a day (TID) | ORAL | 0 refills | Status: AC | PRN

## 2021-05-23 MED ORDER — KETOROLAC TROMETHAMINE 30 MG/ML IJ SOLN
30.0000 mg | Freq: Once | INTRAMUSCULAR | Status: DC
  Filled 2021-05-23: qty 1

## 2021-05-23 MED ORDER — DICYCLOMINE HCL 10 MG PO CAPS: 10.0000 mg | ORAL_CAPSULE | Freq: Three times a day (TID) | ORAL | 0 refills | Status: DC

## 2021-05-23 NOTE — ED Notes (Signed)
ED Provider at bedside. 

## 2021-05-23 NOTE — ED Triage Notes (Signed)
Pt to ED via POV for right flank pain x 2 days. Pt reports that the pain is worse at night. Pt is in NAD .

## 2021-05-23 NOTE — Discharge Instructions (Addendum)
You can take Zofran and Bentyl for nausea and diarrhea. You can continue to take Tylenol and ibuprofen alternating for abdominal discomfort. If symptoms do not improve in a week, please make follow-up appointment with your primary care provider or return to the emergency department for reevaluation.

## 2021-05-23 NOTE — ED Provider Notes (Signed)
[facility]-EMERGENCY DEPARTMENT  ____________________________________________  Time seen: Approximately 3:20 PM  I have reviewed the triage vital signs and the nursing notes.   HISTORY  Chief Complaint Flank Pain   Historian Patient     HPI Sabrina Bell is a 20 y.o. female with a history of diabetes, asthma and migraines, presents to the emergency department with some episodic right-sided flank pain that seems to extend to the lateral right aspect of her abdomen.  Patient states that she has had diarrhea for the past 2 days.  She states that she has had occasional nausea but no vomiting.  She has been afebrile at home.  No associated rhinorrhea, nasal congestion or nonproductive cough.  She denies dysuria, hematuria or increased urinary frequency.  She states that she had a urinary tract infection in the past and her current symptoms do not feel similar.  No history of pyelonephritis or nephrolithiasis.  No falls or mechanisms of trauma.  Denies sick contacts in the home with similar symptoms.  Denies a history of GI issues in the past or surgeries.  She reports her last bowel movement was today.  Reports that she is able to pass gas.  No other alleviating measures have been attempted.   Past Medical History:  Diagnosis Date   Asthma    Borderline diabetes    Depression    Diabetes mellitus without complication ([place])    Migraines    Obesity      Immunizations up to date:  Yes.     Past Medical History:  Diagnosis Date   Asthma    Borderline diabetes    Depression    Diabetes mellitus without complication ([place])    Migraines    Obesity     Patient Active Problem List   Diagnosis Date Noted   Iron deficiency 05/17/2020   Chronic iron deficiency anemia 02/03/2020   Prediabetes 02/03/2020   Asthma exacerbation 02/03/2020   Acute exacerbation of severe persistent extrinsic asthma 01/28/2020   Asthma 08/13/2019   Abnormal abdominal ultrasound 08/13/2019   Menorrhagia  08/10/2019   Abdominal pain 08/10/2019   Status asthmaticus 11/05/2018   Amenorrhea 11/05/2018   Social isolation 11/05/2018   Educational circumstances 11/05/2018   OSA (obstructive sleep apnea) 05/27/2017   Hypersomnia    Morbid obesity with BMI of 70 and over, adult [facility])    Essential hypertension    Acanthosis nigricans, acquired    Dyspepsia    Anemia    Adjustment reaction with anxiety and depression    Acute respiratory failure with hypoxia (HCC)    Insomnia 11/29/2015   Generalized anxiety disorder 11/29/2015   MDD (major depressive disorder), recurrent episode, severe ([place]) 11/28/2015    History reviewed. No pertinent surgical history.  Prior to Admission medications   Medication Sig Start Date End Date Taking? Authorizing Provider  dicyclomine (BENTYL) 10 MG capsule Take 1 capsule (10 mg total) by mouth 4 (four) times daily -  before meals and at bedtime for 5 days. 05/23/21 05/28/21 Yes Vallarie Mare M, PA-C  ondansetron (ZOFRAN ODT) 4 MG disintegrating tablet Take 1 tablet (4 mg total) by mouth every 8 (eight) hours as needed for up to 5 days. 05/23/21 05/28/21 Yes Vallarie Mare M, PA-C  ADVAIR DISKUS 250-50 MCG/DOSE AEPB Inhale 1 puff into the lungs 2 (two) times daily. 03/01/20   [provider]  albuterol (PROVENTIL HFA;VENTOLIN HFA) 108 (90 Base) MCG/ACT inhaler Inhale 1-2 puffs into the lungs every 6 (six) hours as needed for  wheezing or shortness of breath. 11/06/18   Wilber Oliphant, MD  cetirizine (ZYRTEC ALLERGY) 10 MG tablet Take 1 tablet (10 mg total) by mouth daily for 5 days. 04/28/20 05/30/20  Rudene Re, MD  EPINEPHrine 0.3 mg/0.3 mL IJ SOAJ injection Inject 0.3 mLs (0.3 mg total) into the muscle as needed for anaphylaxis. 04/28/20   Rudene Re, MD  famotidine (PEPCID) 20 MG tablet Take 1 tablet (20 mg total) by mouth daily for 5 days. 04/28/20 05/03/20  Rudene Re, MD  ferrous sulfate 325 (65 FE) MG tablet Take 1 tablet (325 mg total) by mouth 2  (two) times daily with a meal. 08/11/19 05/30/20  Burnis Medin, MD  fluticasone (FLONASE) 50 MCG/ACT nasal spray Place 2 sprays into both nostrils daily. Patient not taking: Reported on 09/22/2020 05/30/20   Melynda Ripple, MD  Fluticasone-Salmeterol (ADVAIR DISKUS) 250-50 MCG/DOSE AEPB  03/01/20   [provider]  medroxyPROGESTERone (PROVERA) 10 MG tablet Take 1 tablet (10 mg total) by mouth daily. 03/13/21 04/12/21  Malachy Mood, MD  metFORMIN (GLUCOPHAGE) 500 MG tablet Take 500 mg by mouth 2 (two) times daily. 12/27/19   [provider]  predniSONE (DELTASONE) 50 MG tablet Take 1 tablet (50 mg total) by mouth daily with breakfast. 01/24/21   Cuthriell, Charline Bills, PA-C  Spacer/Aero-Holding Chambers (AEROCHAMBER PLUS) inhaler Use as instructed 05/30/20   Melynda Ripple, MD  Thiamine HCl (VITAMIN B-1) 250 MG tablet Take 250 mg by mouth daily. Patient not taking: Reported on 09/22/2020    [provider]  fluticasone (FLOVENT HFA) 110 MCG/ACT inhaler Inhale 2 puffs into the lungs 2 (two) times daily. 11/06/18 05/30/20  Wilber Oliphant, MD    Allergies Cashew nut oil, Omnicef [cefdinir], Other, and Watermelon flavor [flavoring agent]  Family History  Problem Relation Age of Onset   Diabetes Mother    Vision loss Mother    Ovarian cancer Paternal Grandmother    Prostate cancer Paternal Grandfather     Social History Social History   Tobacco Use   Smoking status: Never   Smokeless tobacco: Never  Vaping Use   Vaping Use: Never used  Substance Use Topics   Alcohol use: No   Drug use: No     Review of Systems  Constitutional: No fever/chills Eyes:  No discharge ENT: No upper respiratory complaints. Respiratory: no cough. No SOB/ use of accessory muscles to breath Gastrointestinal: Patient has abdominal discomfort.  Musculoskeletal: Negative for musculoskeletal pain. Skin: Negative for rash, abrasions, lacerations,  ecchymosis.    ____________________________________________   PHYSICAL EXAM:  VITAL SIGNS: ED Triage Vitals  Enc Vitals Group     BP 05/23/21 1326 (!) 142/72     Pulse Rate 05/23/21 1326 72     Resp 05/23/21 1326 20     Temp 05/23/21 1326 97.9 F (36.6 C)     Temp Source 05/23/21 1326 Oral     SpO2 05/23/21 1326 99 %     Weight 05/23/21 1327 (!) 444 lb 10.7 oz (201.7 kg)     Height 05/23/21 1327 '5\' 7"'$  (1.702 m)     Head Circumference --      Peak Flow --      Pain Score 05/23/21 1334 5     Pain Loc --      Pain Edu? --      Excl. in [place]? --      Constitutional: Alert and oriented. Well appearing and in no acute distress. Eyes: Conjunctivae are normal. PERRL.  EOMI. Head: Atraumatic. ENT:      Nose: No congestion/rhinnorhea.      Mouth/Throat: Mucous membranes are moist.  Neck: No stridor.  No cervical spine tenderness to palpation. Cardiovascular: Normal rate, regular rhythm. Normal S1 and S2.  Good peripheral circulation. Respiratory: Normal respiratory effort without tachypnea or retractions. Lungs CTAB. Good air entry to the bases with no decreased or absent breath sounds Gastrointestinal: Bowel sounds x 4 quadrants. Soft and nontender to palpation. No guarding or rigidity. No distention.  No CVA tenderness. Musculoskeletal: Full range of motion to all extremities. No obvious deformities noted Neurologic:  Normal for age. No gross focal neurologic deficits are appreciated.  Skin:  Skin is warm, dry and intact. No rash noted. Psychiatric: Mood and affect are normal for age. Speech and behavior are normal.   ____________________________________________   LABS (all labs ordered are listed, but only abnormal results are displayed)  Labs Reviewed  URINALYSIS, COMPLETE (UACMP) WITH MICROSCOPIC - Abnormal; Notable for the following components:      Result Value   Color, Urine YELLOW (*)    APPearance CLOUDY (*)    Bacteria, UA RARE (*)    All other components within  normal limits  BASIC METABOLIC PANEL - Abnormal; Notable for the following components:   Glucose, Bld 122 (*)    All other components within normal limits  CBC WITH DIFFERENTIAL/PLATELET - Abnormal; Notable for the following components:   Hemoglobin 10.1 (*)    HCT 33.3 (*)    MCH 25.3 (*)    RDW 15.6 (*)    All other components within normal limits  POC URINE PREG, ED   ____________________________________________  EKG   ____________________________________________  RADIOLOGY   No results found.  ____________________________________________    PROCEDURES  Procedure(s) performed:     Procedures     Medications - No data to display   ____________________________________________   INITIAL IMPRESSION / ASSESSMENT AND PLAN / ED COURSE  Pertinent labs & imaging results that were available during my care of the patient were reviewed by me and considered in my medical decision making (see chart for details).      Assessment and Plan: Flank pain Diarrhea 20 year old female presents to the emergency department with intermittent right-sided flank pain that has occurred since Friday in association with diarrhea.  Patient was mildly tachycardic at triage but vital signs were otherwise reassuring.  On exam, patient appeared to be resting comfortably in a supine position.  She had no abdominal tenderness on exam, notably no abdominal tenderness in the right upper quadrant.  She had no CVA tenderness.  CBC was reassuring without elevated white blood cell count.  CMP was within reference range aside from mild hyperglycemia.  Urinalysis showed no signs of UTI without blood to suggest nephrolithiasis.  Urine pregnancy testing was negative.  Given patient's comfort level on exam, low suspicion for nephrolithiasis.  We will send patient's urine for culture and will give injection of Toradol in the emergency department for pain.  Recommended Zofran and Bentyl for diarrhea and  occasional nausea.  Patient declined COVID-19 testing stating that she did not think she had COVID at this time.  Recommended return to the emergency department for reevaluation or follow-up with PCP in 1 week if symptoms do not improve.  All patient questions were answered.      ____________________________________________  FINAL CLINICAL IMPRESSION(S) / ED DIAGNOSES  Final diagnoses:  Flank pain  Diarrhea, unspecified type      NEW MEDICATIONS  STARTED DURING THIS VISIT:  ED Discharge Orders          Ordered    ondansetron (ZOFRAN ODT) 4 MG disintegrating tablet  Every 8 hours PRN        05/23/21 1518    dicyclomine (BENTYL) 10 MG capsule  3 times daily before meals & bedtime        05/23/21 1518                This chart was dictated using voice recognition software/Dragon. Despite best efforts to proofread, errors can occur which can change the meaning. Any change was purely unintentional.     Lannie Fields, PA-C 05/23/21 1525    Duffy Bruce, MD 05/23/21 2245

## 2021-12-08 ENCOUNTER — Emergency Department: Payer: BC Managed Care – PPO

## 2021-12-08 ENCOUNTER — Emergency Department
Admission: EM | Admit: 2021-12-08 | Discharge: 2021-12-08 | Disposition: A | Discharge status: Home / Self Care | Payer: BC Managed Care – PPO | Attending: Emergency Medicine | Admitting: Emergency Medicine

## 2021-12-08 ENCOUNTER — Encounter: Payer: Self-pay | Admitting: Emergency Medicine

## 2021-12-08 ENCOUNTER — Other Ambulatory Visit: Payer: Self-pay

## 2021-12-08 DIAGNOSIS — R9431 Abnormal electrocardiogram [ECG] [EKG]: Secondary | ICD-10-CM | POA: Diagnosis not present

## 2021-12-08 DIAGNOSIS — R110 Nausea: Secondary | ICD-10-CM | POA: Insufficient documentation

## 2021-12-08 DIAGNOSIS — R197 Diarrhea, unspecified: Secondary | ICD-10-CM | POA: Insufficient documentation

## 2021-12-08 DIAGNOSIS — R1011 Right upper quadrant pain: Secondary | ICD-10-CM | POA: Insufficient documentation

## 2021-12-08 DIAGNOSIS — R109 Unspecified abdominal pain: Secondary | ICD-10-CM | POA: Diagnosis not present

## 2021-12-08 DIAGNOSIS — R1084 Generalized abdominal pain: Secondary | ICD-10-CM | POA: Diagnosis not present

## 2021-12-08 LAB — COMPREHENSIVE METABOLIC PANEL
ALT: 20 U/L (ref 0–44)
AST: 15 U/L (ref 15–41)
Albumin: 3.3 g/dL — ABNORMAL LOW (ref 3.5–5.0)
Alkaline Phosphatase: 52 U/L (ref 38–126)
Anion gap: 6 (ref 5–15)
BUN: 15 mg/dL (ref 6–20)
CO2: 25 mmol/L (ref 22–32)
Calcium: 8.8 mg/dL — ABNORMAL LOW (ref 8.9–10.3)
Chloride: 104 mmol/L (ref 98–111)
Creatinine, Ser: 0.83 mg/dL (ref 0.44–1.00)
GFR, Estimated: >60 mL/min (ref >60)
Glucose, Bld: 100 mg/dL — ABNORMAL HIGH (ref 70–99)
Potassium: 3.7 mmol/L (ref 3.5–5.1)
Sodium: 135 mmol/L (ref 135–145)
Total Bilirubin: 0.3 mg/dL (ref 0.3–1.2)
Total Protein: 7.8 g/dL (ref 6.5–8.1)

## 2021-12-08 LAB — CBC
HCT: 32.2 % — ABNORMAL LOW (ref 36.0–46.0)
Hemoglobin: 9.5 g/dL — ABNORMAL LOW (ref 12.0–15.0)
MCH: 24.8 pg — ABNORMAL LOW (ref 26.0–34.0)
MCHC: 29.5 g/dL — ABNORMAL LOW (ref 30.0–36.0)
MCV: 84.1 fL (ref 80.0–100.0)
Platelets: 306 10*3/uL (ref 150–400)
RBC: 3.83 MIL/uL — ABNORMAL LOW (ref 3.87–5.11)
RDW: 15.4 % (ref 11.5–15.5)
WBC: 10.7 10*3/uL — ABNORMAL HIGH (ref 4.0–10.5)
nRBC: 0 % (ref 0.0–0.2)

## 2021-12-08 LAB — POC URINE PREG, ED: Preg Test, Ur: NEGATIVE

## 2021-12-08 LAB — URINALYSIS, ROUTINE W REFLEX MICROSCOPIC
Bilirubin Urine: NEGATIVE
Glucose, UA: NEGATIVE mg/dL
Hgb urine dipstick: NEGATIVE
Ketones, ur: NEGATIVE mg/dL
Leukocytes,Ua: NEGATIVE
Nitrite: NEGATIVE
Protein, ur: NEGATIVE mg/dL
Specific Gravity, Urine: 1.025 (ref 1.005–1.030)
pH: 5 (ref 5.0–8.0)

## 2021-12-08 LAB — LIPASE, BLOOD: Lipase: 27 U/L (ref 11–51)

## 2021-12-08 MED ORDER — ACETAMINOPHEN 500 MG PO TABS
1000.0000 mg | ORAL_TABLET | Freq: Once | ORAL | Status: AC
  Administered 2021-12-08: 1000 mg via ORAL
  Filled 2021-12-08: qty 2

## 2021-12-08 MED ORDER — ONDANSETRON 4 MG PO TBDP
4.0000 mg | ORAL_TABLET | Freq: Once | ORAL | Status: AC
Start: 2021-12-08 — End: 2021-12-08
  Administered 2021-12-08: 4 mg via ORAL
  Filled 2021-12-08: qty 1

## 2021-12-08 MED ORDER — NAPROXEN 500 MG PO TABS
500.0000 mg | ORAL_TABLET | Freq: Once | ORAL | Status: AC
  Administered 2021-12-08: 500 mg via ORAL
  Filled 2021-12-08: qty 1

## 2021-12-08 MED ORDER — ONDANSETRON 4 MG PO TBDP: 4.0000 mg | ORAL_TABLET | Freq: Three times a day (TID) | ORAL | 0 refills | Status: DC | PRN

## 2021-12-08 NOTE — Discharge Instructions (Signed)
Please take Tylenol and ibuprofen/Advil for your pain.  It is safe to take them together, or to alternate them every few hours.  Take up to 1000mg of Tylenol at a time, up to 4 times per day.  Do not take more than 4000 mg of Tylenol in 24 hours.  For ibuprofen, take 400-600 mg, 4-5 times per day. ° ° °

## 2021-12-08 NOTE — ED Provider Notes (Signed)
[facility] Provider Note    Event Date/Time   First MD Initiated Contact with Patient 12/08/21 0148     (approximate)   History   Abdominal Pain   HPI  Sabrina Bell is a 21 y.o. female who presents to the ED for evaluation of Abdominal Pain   BMI of 62.  Patient presents to the ED for evaluation of right-sided abdominal discomfort, nausea and diarrhea over the past few days.  She reports pain throughout her right-sided abdomen, right flank and radiating towards her right-sided lumbar back over the past 4 to 5 days.  Reports nausea and watery diarrhea without hematochezia, emesis, melena, dysuria or hematuria.  Denies any vaginal discharge or bleeding.  Physical Exam   Triage Vital Signs: ED Triage Vitals  Enc Vitals Group     BP 12/08/21 0021 133/61     Pulse Rate 12/08/21 0021 86     Resp 12/08/21 0021 20     Temp 12/08/21 0021 98 F (36.7 C)     Temp Source 12/08/21 0021 Oral     SpO2 12/08/21 0021 100 %     Weight 12/08/21 0022 (!) 400 lb (181.4 kg)     Height 12/08/21 0022 5\' 7"  (1.702 m)     Head Circumference --      Peak Flow --      Pain Score 12/08/21 0022 8     Pain Loc --      Pain Edu? --      Excl. in [place]? --     Most recent vital signs: Vitals:   12/08/21 0341 12/08/21 0400  BP: (!) 121/48   Pulse: 72   Resp: 20   Temp:  98.3 F (36.8 C)  SpO2: 100%     General: Awake, no distress.  Morbidly obese. CV:  Good peripheral perfusion. RRR Resp:  Normal effort.  Abd:  Difficult due to body habitus and large pannus.  Right-sided abdominal tenderness without peritoneal features is present. MSK:  No deformity noted.  Neuro:  No focal deficits appreciated. Other:     ED Results / Procedures / Treatments   Labs (all labs ordered are listed, but only abnormal results are displayed) Labs Reviewed  COMPREHENSIVE METABOLIC PANEL - Abnormal; Notable for the following components:      Result Value   Glucose, Bld 100 (*)     Calcium 8.8 (*)    Albumin 3.3 (*)    All other components within normal limits  CBC - Abnormal; Notable for the following components:   WBC 10.7 (*)    RBC 3.83 (*)    Hemoglobin 9.5 (*)    HCT 32.2 (*)    MCH 24.8 (*)    MCHC 29.5 (*)    All other components within normal limits  URINALYSIS, ROUTINE W REFLEX MICROSCOPIC - Abnormal; Notable for the following components:   Color, Urine YELLOW (*)    APPearance HAZY (*)    All other components within normal limits  LIPASE, BLOOD  POC URINE PREG, ED    EKG Sinus rhythm, rate of 87 bpm.  Normal axis and intervals.  No evidence of acute ischemia.  RADIOLOGY CT abdomen/pelvis reviewed by me without evidence of intra-abdominal pathology.  Official radiology report(s): CT ABDOMEN PELVIS WO CONTRAST  Result Date: 12/08/2021 CLINICAL DATA:  Right-sided abdominal pain with nausea and vomiting EXAM: CT ABDOMEN AND PELVIS WITHOUT CONTRAST TECHNIQUE: Multidetector CT imaging of the abdomen and pelvis was performed following  the standard protocol without IV contrast. RADIATION DOSE REDUCTION: This exam was performed according to the departmental dose-optimization program which includes automated exposure control, adjustment of the mA and/or kV according to patient size and/or use of iterative reconstruction technique. COMPARISON:  12/14/2016 FINDINGS: Lower chest: Minimal scarring is noted in the right lung base. Hepatobiliary: No focal liver abnormality is seen. No gallstones, gallbladder wall thickening, or biliary dilatation. Pancreas: Unremarkable. No pancreatic ductal dilatation or surrounding inflammatory changes. Spleen: Normal in size without focal abnormality. Adrenals/Urinary Tract: Adrenal glands are within normal limits. Tiny punctate right upper pole renal stone is noted. No obstructive changes are seen. The ureters are within normal limits. The bladder is decompressed. Stomach/Bowel: Colon is predominately decompressed. No obstructive  or inflammatory changes are seen. The appendix is within normal limits. Small bowel and stomach are unremarkable. Vascular/Lymphatic: No significant vascular findings are present. No enlarged abdominal or pelvic lymph nodes. Reproductive: Uterus and bilateral adnexa are unremarkable. Other: No abdominal wall hernia or abnormality. No abdominopelvic ascites. Musculoskeletal: No acute or significant osseous findings. IMPRESSION: Tiny punctate right renal stone without obstructive change. Normal appearing appendix. No other focal abnormality is noted. Electronically Signed   By: Inez Catalina M.D.   On: 12/08/2021 02:54    PROCEDURES and INTERVENTIONS:  Procedures  Medications  ondansetron (ZOFRAN-ODT) disintegrating tablet 4 mg (4 mg Oral Given 12/08/21 0242)  acetaminophen (TYLENOL) tablet 1,000 mg (1,000 mg Oral Given 12/08/21 0338)  naproxen (NAPROSYN) tablet 500 mg (500 mg Oral Given 12/08/21 0338)     IMPRESSION / MDM / ASSESSMENT AND PLAN / ED COURSE  I reviewed the triage vital signs and the nursing notes.  Super morbid obese 21 year old female presents to the ED with a few days of abdominal discomfort and diarrhea, without evidence of significant acute pathology and suitable for outpatient management.  No tachycardia or fever.  Looks clinically well.  Poorly localizing and mild right-sided tenderness without peritoneal features.  Urine is noninfectious and she is not pregnant.  Metabolic panel is normal and CBC with chronic anemia and marginal leukocytosis.  No evidence of pancreatitis.  CT obtained due to her body habitus and does not demonstrate evidence of acute intra-abdominal pathology.  Resolving symptoms with Zofran, Tylenol and NSAIDs.  Tolerating p.o. intake and I see no barriers to outpatient management.  We discussed return precautions for the ED and she is suitable for outpatient management.  Clinical Course as of 12/08/21 0402  Sat Dec 08, 2021  0333 Reassessed.  Patient reports  feeling better after the Zofran.  We discussed benign work-up. [DS]  3875 Reassessed.  Tolerating p.o. intake and feels better now.  We discussed possible etiologies of her symptoms and return precautions for the ED. [DS]    Clinical Course User Index [DS] Vladimir Crofts, MD     FINAL CLINICAL IMPRESSION(S) / ED DIAGNOSES   Final diagnoses:  Right upper quadrant abdominal pain  Nausea  Diarrhea, unspecified type     Rx / DC Orders   ED Discharge Orders          Ordered    ondansetron (ZOFRAN-ODT) 4 MG disintegrating tablet  Every 8 hours PRN        12/08/21 0359             Note:  This document was prepared using Dragon voice recognition software and may include unintentional dictation errors.   Vladimir Crofts, MD 12/08/21 [phone]

## 2021-12-08 NOTE — ED Notes (Signed)
Pt given sprite and graham crackers.  

## 2021-12-08 NOTE — ED Triage Notes (Signed)
Pt presents to ER via EMS with complaints of abdominal pain RUQ radiating to back. Reports diarrhea for 4 days same time frame since she developed abdominal pain. Pt talks in complete sentences no respiratory distress noted. Per EMS CBG 107, HR 79, 99%151/77, 18.  Pain 7/10

## 2022-02-23 ENCOUNTER — Encounter: Payer: Self-pay | Admitting: Emergency Medicine

## 2022-02-23 ENCOUNTER — Other Ambulatory Visit: Payer: Self-pay

## 2022-02-23 DIAGNOSIS — J45909 Unspecified asthma, uncomplicated: Secondary | ICD-10-CM | POA: Diagnosis not present

## 2022-02-23 DIAGNOSIS — R1310 Dysphagia, unspecified: Secondary | ICD-10-CM | POA: Diagnosis not present

## 2022-02-23 DIAGNOSIS — R0989 Other specified symptoms and signs involving the circulatory and respiratory systems: Secondary | ICD-10-CM | POA: Diagnosis not present

## 2022-02-23 DIAGNOSIS — F458 Other somatoform disorders: Secondary | ICD-10-CM | POA: Diagnosis not present

## 2022-02-23 DIAGNOSIS — N898 Other specified noninflammatory disorders of vagina: Secondary | ICD-10-CM | POA: Diagnosis not present

## 2022-02-23 DIAGNOSIS — E119 Type 2 diabetes mellitus without complications: Secondary | ICD-10-CM | POA: Diagnosis not present

## 2022-02-23 NOTE — ED Triage Notes (Signed)
This RN spoke with Dr. Charna Archer regarding patient care, per Dr. Charna Archer, no orders at this time.  ?

## 2022-02-23 NOTE — ED Triage Notes (Signed)
Pt to ED via POV with c/o feeling like something is stuck in her throat, states sensation x several days. Pt states when feeling started she was eating. Pt denies getting choked at the time. Pt's SO reports pt was having difficulty swallowing food. Pt states has been able to eat since the sensation started however reports difficulty with eating. Pt reports pain to upper neck. Pt able to maintain her own secretions at this time.  ?

## 2022-02-23 NOTE — ED Triage Notes (Signed)
FIRST NURSE NOTE:  Pt arrived via POV with c/o trouble swallowing, thinks something is stuck in her throat but unsure what, pt's SO reports she had something from [employer] a few days ago.  ?

## 2022-02-24 ENCOUNTER — Emergency Department
Admission: EM | Admit: 2022-02-24 | Discharge: 2022-02-24 | Disposition: A | Discharge status: Home / Self Care | Payer: BC Managed Care – PPO | Attending: Emergency Medicine | Admitting: Emergency Medicine

## 2022-02-24 DIAGNOSIS — R09A2 Foreign body sensation, throat: Secondary | ICD-10-CM

## 2022-02-24 DIAGNOSIS — R0989 Other specified symptoms and signs involving the circulatory and respiratory systems: Secondary | ICD-10-CM

## 2022-02-24 MED ORDER — ONDANSETRON HCL 4 MG/2ML IJ SOLN
4.0000 mg | Freq: Once | INTRAMUSCULAR | Status: AC
  Administered 2022-02-24: 4 mg via INTRAVENOUS
  Filled 2022-02-24: qty 2

## 2022-02-24 MED ORDER — GLUCAGON HCL RDNA (DIAGNOSTIC) 1 MG IJ SOLR
1.0000 mg | Freq: Once | INTRAMUSCULAR | Status: AC
  Administered 2022-02-24: 1 mg via INTRAVENOUS
  Filled 2022-02-24: qty 1

## 2022-02-24 MED ORDER — ALUM & MAG HYDROXIDE-SIMETH 200-200-20 MG/5ML PO SUSP
30.0000 mL | Freq: Once | ORAL | Status: AC
  Administered 2022-02-24: 30 mL via ORAL
  Filled 2022-02-24: qty 30

## 2022-02-24 MED ORDER — FAMOTIDINE IN NACL 20-0.9 MG/50ML-% IV SOLN
20.0000 mg | Freq: Once | INTRAVENOUS | Status: AC
Start: 2022-02-24 — End: 2022-02-24
  Administered 2022-02-24: 20 mg via INTRAVENOUS
  Filled 2022-02-24: qty 50

## 2022-02-24 NOTE — ED Provider Notes (Signed)
? ?[facility] ?Provider Note ? ? ? Event Date/Time  ? First MD Initiated Contact with Patient 02/24/22 0019   ?  (approximate) ? ? ?History  ? ?Dysphagia ? ? ?HPI ? ?Sabrina Bell is a 21 y.o. female with a history of morbid obesity, diabetes who presents for evaluation of difficulty swallowing.  Patient reports that her symptoms started 2 nights ago after having [employer].  She reports having nuggets and a cheeseburger.  She felt like something got stuck in her throat.  She has been able to continue to eat and drink without any vomiting and is handling her secretions without any difficulty.  She does feel like something is stuck in her throat.  She does not think that she is swallowed anything sharp.  She has had no vomiting, no hematemesis, no chest pain or shortness of breath.  No difficulty breathing. ?  ? ? ?Past Medical History:  ?Diagnosis Date  ? Asthma   ? Borderline diabetes   ? Depression   ? Diabetes mellitus without complication ([place])   ? Migraines   ? Obesity   ? ? ?History reviewed. No pertinent surgical history. ? ? ?Physical Exam  ? ?Triage Vital Signs: ?ED Triage Vitals [02/23/22 2215]  ?Enc Vitals Group  ?   BP (!) 105/51  ?   Pulse Rate 94  ?   Resp 20  ?   Temp 98.3 ?F (36.8 ?C)  ?   Temp Source Oral  ?   SpO2 95 %  ?   Weight (!) 400 lb (181.4 kg)  ?   Height '5\' 7"'$  (1.702 m)  ?   Head Circumference   ?   Peak Flow   ?   Pain Score 0  ?   Pain Loc   ?   Pain Edu?   ?   Excl. in [place]?   ? ? ?Most recent vital signs: ?Vitals:  ? 02/24/22 0105 02/24/22 0212  ?BP: (!) 129/59 (!) 130/59  ?Pulse: 70 67  ?Resp: (!) 22 19  ?Temp:    ?SpO2: 100% 100%  ? ? ? ?Constitutional: Alert and oriented. Well appearing and in no apparent distress. ?HEENT: ?     Head: Normocephalic and atraumatic.    ?     Eyes: Conjunctivae are normal. Sclera is non-icteric.  ?     Mouth/Throat: Mucous membranes are moist.  Oropharynx is clear with no exudates, no tonsillar hypertrophy, airways patent, no  stridor ?     Neck: Supple with no signs of meningismus. ?Cardiovascular: Regular rate and rhythm.  ?Respiratory: Normal respiratory effort. Lungs are clear to auscultation bilaterally.  ?Gastrointestinal: Soft, non tender, and non distended with positive bowel sounds. No rebound or guarding. ?Genitourinary: No CVA tenderness. ?Musculoskeletal:  No edema, cyanosis, or erythema of extremities. ?Neurologic: Normal speech and language. Face is symmetric. Moving all extremities. No gross focal neurologic deficits are appreciated. ?Skin: Skin is warm, dry and intact. No rash noted. ?Psychiatric: Mood and affect are normal. Speech and behavior are normal. ? ?ED Results / Procedures / Treatments  ? ?Labs ?(all labs ordered are listed, but only abnormal results are displayed) ?Labs Reviewed - No data to display ? ? ?EKG ? ?none ? ? ?RADIOLOGY ?none ? ? ?PROCEDURES: ? ?Critical Care performed: No ? ?Procedures ? ? ? ?IMPRESSION / MDM / ASSESSMENT AND PLAN / ED COURSE  ?I reviewed the triage vital signs and the nursing notes. ? ?21 y.o. female with a  history of morbid obesity, diabetes who presents for evaluation of difficulty swallowing.  Patient reports symptoms started 2 days ago after eating [employer].  She is still able to swallow solids and liquids without any vomiting.  She has the sensation that something is stuck in her throat.  She denies ever having esophageal food impaction in the past.  On exam she is well-appearing in no distress, exam is nonfocal.  Patient was given IV glucagon, IV Pepcid, Zofran and a GI cocktail.  She is able to tolerate p.o. both solids and liquids without any vomiting or spitting up.  She is handling her saliva with no difficulty.  No difficulty breathing, no changes in her voice, no neck pain.  At this time patient's presentation is concerning for globus pharyngeus versus esophageal trauma from swallowing may be a piece of bone or something sharp versus small esophageal food impaction  with no obstruction.  No indication for emergent endoscopy.  Patient referred to GI for outpatient follow-up.  Discussed my standard return precautions. ? ?MEDICATIONS GIVEN IN ED: ?Medications  ?glucagon (human recombinant) (GLUCAGEN) injection 1 mg (1 mg Intravenous Given 02/24/22 0139)  ?ondansetron (ZOFRAN) injection 4 mg (4 mg Intravenous Given 02/24/22 0140)  ?alum & mag hydroxide-simeth (MAALOX/MYLANTA) 200-200-20 MG/5ML suspension 30 mL (30 mLs Oral Given 02/24/22 0104)  ?famotidine (PEPCID) IVPB 20 mg premix (0 mg Intravenous Stopped 02/24/22 0211)  ? ? ?EMR reviewed including patient's last visit with her primary care doctor from 2021 for anemia and obesity ? ? ? ?FINAL CLINICAL IMPRESSION(S) / ED DIAGNOSES  ? ?Final diagnoses:  ?Globus pharyngeus  ? ? ? ?Rx / DC Orders  ? ?ED Discharge Orders   ? ? None  ? ?  ? ? ? ?Note:  This document was prepared using Dragon voice recognition software and may include unintentional dictation errors. ? ? ?Please note:  Patient was evaluated in Emergency Department today for the symptoms described in the history of present illness. Patient was evaluated in the context of the global COVID-19 pandemic, which necessitated consideration that the patient might be at risk for infection with the SARS-CoV-2 virus that causes COVID-19. Institutional protocols and algorithms that pertain to the evaluation of patients at risk for COVID-19 are in a state of rapid change based on information released by regulatory bodies including the CDC and federal and state organizations. These policies and algorithms were followed during the patient's care in the ED.  Some ED evaluations and interventions may be delayed as a result of limited staffing during the pandemic. ? ? ? ? ?  ?Rudene Re, MD ?02/24/22 0320 ? ?

## 2022-02-24 NOTE — ED Notes (Signed)
Pt given apple juice, ice cream, and applesauce to see how well she tolerates swallowing  ?

## 2022-02-24 NOTE — ED Notes (Signed)
Pt was able to eat her ice cream, applesauce, and drink some apple juice w/o difficulty. Pt able to keep food content down. No n/v. Pt reports "hurts to swallow"  ?

## 2022-03-02 ENCOUNTER — Emergency Department
Admission: EM | Admit: 2022-03-02 | Discharge: 2022-03-03 | Disposition: A | Discharge status: Home / Self Care | Payer: BC Managed Care – PPO | Attending: Emergency Medicine | Admitting: Emergency Medicine

## 2022-03-02 ENCOUNTER — Other Ambulatory Visit: Payer: Self-pay

## 2022-03-02 ENCOUNTER — Encounter: Payer: Self-pay | Admitting: Emergency Medicine

## 2022-03-02 DIAGNOSIS — R0689 Other abnormalities of breathing: Secondary | ICD-10-CM | POA: Diagnosis not present

## 2022-03-02 DIAGNOSIS — E119 Type 2 diabetes mellitus without complications: Secondary | ICD-10-CM | POA: Diagnosis not present

## 2022-03-02 DIAGNOSIS — J45901 Unspecified asthma with (acute) exacerbation: Secondary | ICD-10-CM | POA: Diagnosis not present

## 2022-03-02 DIAGNOSIS — Z20822 Contact with and (suspected) exposure to covid-19: Secondary | ICD-10-CM | POA: Insufficient documentation

## 2022-03-02 DIAGNOSIS — R069 Unspecified abnormalities of breathing: Secondary | ICD-10-CM | POA: Diagnosis not present

## 2022-03-02 DIAGNOSIS — R0602 Shortness of breath: Secondary | ICD-10-CM | POA: Diagnosis not present

## 2022-03-02 DIAGNOSIS — Z7984 Long term (current) use of oral hypoglycemic drugs: Secondary | ICD-10-CM | POA: Diagnosis not present

## 2022-03-02 DIAGNOSIS — Z7951 Long term (current) use of inhaled steroids: Secondary | ICD-10-CM | POA: Diagnosis not present

## 2022-03-02 MED ORDER — IPRATROPIUM-ALBUTEROL 0.5-2.5 (3) MG/3ML IN SOLN
3.0000 mL | Freq: Once | RESPIRATORY_TRACT | Status: AC
  Administered 2022-03-03: 3 mL via RESPIRATORY_TRACT
  Filled 2022-03-02: qty 3

## 2022-03-02 NOTE — ED Provider Notes (Signed)
? ?[facility] ?Provider Note ? ? ? Event Date/Time  ? First MD Initiated Contact with Patient 03/02/22 2342   ?  (approximate) ? ? ?History  ? ?Shortness of Breath ? ? ?HPI ? ?Sabrina Bell is a 21 y.o. female with history of morbid obesity, diabetes, migraines, asthma who presents to the emergency department shortness of breath that started tonight.  States she has had wheezing.  No fevers or cough.  No chest pain.  Denies history of PE or DVT.  Has been using breathing treatments at home and received albuterol, DuoNebs and IM Solu-Medrol with EMS.  States she is starting to feel better. ? ? ?History provided by patient and EMS. ? ? ? ?Past Medical History:  ?Diagnosis Date  ? Asthma   ? Borderline diabetes   ? Depression   ? Diabetes mellitus without complication ([place])   ? Migraines   ? Obesity   ? ? ?History reviewed. No pertinent surgical history. ? ?MEDICATIONS:  ?Prior to Admission medications   ?Medication Sig Start Date End Date Taking? Authorizing Provider  ?ADVAIR DISKUS 250-50 MCG/DOSE AEPB Inhale 1 puff into the lungs 2 (two) times daily. 03/01/20   [provider]  ?albuterol (PROVENTIL HFA;VENTOLIN HFA) 108 (90 Base) MCG/ACT inhaler Inhale 1-2 puffs into the lungs every 6 (six) hours as needed for wheezing or shortness of breath. 11/06/18   Wilber Oliphant, MD  ?cetirizine (ZYRTEC ALLERGY) 10 MG tablet Take 1 tablet (10 mg total) by mouth daily for 5 days. 04/28/20 05/30/20  Rudene Re, MD  ?dicyclomine (BENTYL) 10 MG capsule Take 1 capsule (10 mg total) by mouth 4 (four) times daily -  before meals and at bedtime for 5 days. 05/23/21 05/28/21  Lannie Fields, PA-C  ?EPINEPHrine 0.3 mg/0.3 mL IJ SOAJ injection Inject 0.3 mLs (0.3 mg total) into the muscle as needed for anaphylaxis. 04/28/20   Rudene Re, MD  ?famotidine (PEPCID) 20 MG tablet Take 1 tablet (20 mg total) by mouth daily for 5 days. 04/28/20 05/03/20  Rudene Re, MD  ?ferrous sulfate 325 (65 FE)  MG tablet Take 1 tablet (325 mg total) by mouth 2 (two) times daily with a meal. 08/11/19 05/30/20  Burnis Medin, MD  ?fluticasone (FLONASE) 50 MCG/ACT nasal spray Place 2 sprays into both nostrils daily. ?Patient not taking: Reported on 09/22/2020 05/30/20   Melynda Ripple, MD  ?Fluticasone-Salmeterol (ADVAIR DISKUS) 250-50 MCG/DOSE AEPB  03/01/20   [provider]  ?medroxyPROGESTERone (PROVERA) 10 MG tablet Take 1 tablet (10 mg total) by mouth daily. 03/13/21 04/12/21  Malachy Mood, MD  ?metFORMIN (GLUCOPHAGE) 500 MG tablet Take 500 mg by mouth 2 (two) times daily. 12/27/19   [provider]  ?ondansetron (ZOFRAN-ODT) 4 MG disintegrating tablet Take 1 tablet (4 mg total) by mouth every 8 (eight) hours as needed. 12/08/21   Vladimir Crofts, MD  ?predniSONE (DELTASONE) 50 MG tablet Take 1 tablet (50 mg total) by mouth daily with breakfast. 01/24/21   Cuthriell, Charline Bills, PA-C  ?Spacer/Aero-Holding Chambers (AEROCHAMBER PLUS) inhaler Use as instructed 05/30/20   Melynda Ripple, MD  ?Thiamine HCl (VITAMIN B-1) 250 MG tablet Take 250 mg by mouth daily. ?Patient not taking: Reported on 09/22/2020    [provider]  ?fluticasone (FLOVENT HFA) 110 MCG/ACT inhaler Inhale 2 puffs into the lungs 2 (two) times daily. 11/06/18 05/30/20  Wilber Oliphant, MD  ? ? ?Physical Exam  ? ?Triage Vital Signs: ?ED Triage Vitals  ?Enc Vitals Group  ?  BP 03/02/22 2330 123/65  ?   Pulse Rate 03/02/22 2330 (!) 106  ?   Resp 03/02/22 2330 19  ?   Temp 03/02/22 2330 98.2 ?F (36.8 ?C)  ?   Temp Source 03/02/22 2330 Oral  ?   SpO2 03/02/22 2324 95 %  ?   Weight 03/02/22 2331 (!) 402 lb (182.3 kg)  ?   Height 03/02/22 2331 '5\' 7"'$  (1.702 m)  ?   Head Circumference --   ?   Peak Flow --   ?   Pain Score 03/02/22 2331 0  ?   Pain Loc --   ?   Pain Edu? --   ?   Excl. in [place]? --   ? ? ?Most recent vital signs: ?Vitals:  ? 03/03/22 0308 03/03/22 0312  ?BP:    ?Pulse: (!) 111   ?Resp:    ?Temp:  97.9 ?F (36.6 ?C)  ?SpO2: 97%    ? ? ?CONSTITUTIONAL: Alert and oriented and responds appropriately to questions.  Overly obese, chronically ill-appearing ?HEAD: Normocephalic, atraumatic ?EYES: Conjunctivae clear, pupils appear equal, sclera nonicteric ?ENT: normal nose; moist mucous membranes ?NECK: Supple, normal ROM ?CARD: RRR; S1 and S2 appreciated; no murmurs, no clicks, no rubs, no gallops ?RESP: Patient is slightly tachypneic, breath sounds clear and equal bilaterally; no wheezes, no rhonchi, no rales, no hypoxia or respiratory distress, speaking full sentences, exam limited due to body habitus ?ABD/GI: Normal bowel sounds; non-distended; soft, non-tender, no rebound, no guarding, no peritoneal signs ?BACK: The back appears normal ?EXT: Normal ROM in all joints; no deformity noted, no edema; no cyanosis ?SKIN: Normal color for age and race; warm; no rash on exposed skin ?NEURO: Moves all extremities equally, normal speech ?PSYCH: The patient's mood and manner are appropriate. ? ? ?ED Results / Procedures / Treatments  ? ?LABS: ?(all labs ordered are listed, but only abnormal results are displayed) ?Labs Reviewed  ?CBC WITH DIFFERENTIAL/PLATELET - Abnormal; Notable for the following components:  ?    Result Value  ? RBC 3.59 (*)   ? Hemoglobin 8.9 (*)   ? HCT 30.3 (*)   ? MCH 24.8 (*)   ? MCHC 29.4 (*)   ? RDW 15.9 (*)   ? All other components within normal limits  ?BLOOD GAS, VENOUS - Abnormal; Notable for the following components:  ? pH, Ven 7.44 (*)   ? pCO2, Ven 39 (*)   ? pO2, Ven 46 (*)   ? Acid-Base Excess 2.3 (*)   ? All other components within normal limits  ?RESP PANEL BY RT-PCR (FLU A&B, COVID) ARPGX2  ?BASIC METABOLIC PANEL  ?HCG, QUANTITATIVE, PREGNANCY  ? ? ? ?EKG: ? EKG Interpretation ? ?Date/Time:  Saturday Mar 02 2022 23:28:17 EDT ?Ventricular Rate:  109 ?PR Interval:    ?QRS Duration: 90 ?QT Interval:  333 ?QTC Calculation: 449 ?R Axis:   4 ?Text Interpretation: Sinus tachycardia Confirmed by Pryor Curia [phone]) on  03/02/2022 11:55:23 PM ?  ? ?  ? ? ? ?RADIOLOGY: ?My personal review and interpretation of imaging: Chest x-ray clear. ? ?I have personally reviewed all radiology reports.   ?DG Chest Portable 1 View ? ?Result Date: 03/03/2022 ?CLINICAL DATA:  Shortness of breath EXAM: PORTABLE CHEST 1 VIEW COMPARISON:  01/28/2021 FINDINGS: Low lung volumes. Heart size accentuated by low volumes and AP portable nature of the study. Lungs clear. No effusions or acute bony abnormality. IMPRESSION: Low lung volumes.  No active disease. Electronically Signed  By: Rolm Baptise M.D.   On: 03/03/2022 00:22   ? ? ?PROCEDURES: ? ?Critical Care performed: No ? ? ? ? ?.1-3 Lead EKG Interpretation ?Performed by: Hobart Marte, Delice Bison, DO ?Authorized by: Mande Auvil, Delice Bison, DO  ? ?  Interpretation: abnormal   ?  ECG rate:  106 ?  ECG rate assessment: tachycardic   ?  Rhythm: sinus tachycardia   ?  Ectopy: none   ?  Conduction: normal   ? ? ? ?IMPRESSION / MDM / ASSESSMENT AND PLAN / ED COURSE  ?I reviewed the triage vital signs and the nursing notes. ? ? ? ?Patient here with shortness of breath, wheezing in the setting of a history of asthma. ? ?The patient is on the cardiac monitor to evaluate for evidence of arrhythmia and/or significant heart rate changes. ? ? ?DIFFERENTIAL DIAGNOSIS (includes but not limited to):   Asthma exacerbation, pneumonia, pneumothorax, less likely CHF, PE, ACS ? ? ?PLAN: We will obtain CBC, BMP, hCG, chest x-ray, COVID and flu swabs.  We will continue breathing treatments.  Lungs are currently clear to auscultation but she does have some increased work of breathing and tachypnea here.  Exam is limited though due to her morbid obesity. ? ? ?MEDICATIONS GIVEN IN ED: ?Medications  ?albuterol (PROVENTIL) (2.5 MG/3ML) 0.083% nebulizer solution 2.5 mg (has no administration in time range)  ?ipratropium-albuterol (DUONEB) 0.5-2.5 (3) MG/3ML nebulizer solution 3 mL (3 mLs Nebulization Given 03/03/22 0003)  ?albuterol (PROVENTIL) (2.5  MG/3ML) 0.083% nebulizer solution 5 mg (5 mg Nebulization Given 03/03/22 0056)  ? ? ? ?ED COURSE: Patient's labs show no leukocytosis.  She has chronic stable anemia.  Normal electrolytes.  VBG reassuring.  COVI

## 2022-03-02 NOTE — ED Triage Notes (Signed)
Pt coming from home via [facility]. Per EMS, pt c/o SOB that started around this afternoon. Pt states she does have a hx of asthma.  ? ?EMS gave pt 2 albuterol treatments, 1 DuoNeb treatments, and 125 mg Solu-medrol IM.  ? ?Pt denies chest pain at this time.  ?

## 2022-03-02 NOTE — ED Notes (Signed)
Attempt iv initiation x2 without success.  ?

## 2022-03-03 ENCOUNTER — Emergency Department: Payer: BC Managed Care – PPO

## 2022-03-03 DIAGNOSIS — R0602 Shortness of breath: Secondary | ICD-10-CM | POA: Diagnosis not present

## 2022-03-03 LAB — BASIC METABOLIC PANEL
Anion gap: 9 (ref 5–15)
BUN: 11 mg/dL (ref 6–20)
CO2: 22 mmol/L (ref 22–32)
Calcium: 8.9 mg/dL (ref 8.9–10.3)
Chloride: 107 mmol/L (ref 98–111)
Creatinine, Ser: 0.76 mg/dL (ref 0.44–1.00)
GFR, Estimated: >60 mL/min (ref >60)
Glucose, Bld: 97 mg/dL (ref 70–99)
Potassium: 3.7 mmol/L (ref 3.5–5.1)
Sodium: 138 mmol/L (ref 135–145)

## 2022-03-03 LAB — CBC WITH DIFFERENTIAL/PLATELET
Abs Immature Granulocytes: 0.03 10*3/uL (ref 0.00–0.07)
Basophils Absolute: 0 10*3/uL (ref 0.0–0.1)
Basophils Relative: 0 %
Eosinophils Absolute: 0.1 10*3/uL (ref 0.0–0.5)
Eosinophils Relative: 1 %
HCT: 30.3 % — ABNORMAL LOW (ref 36.0–46.0)
Hemoglobin: 8.9 g/dL — ABNORMAL LOW (ref 12.0–15.0)
Immature Granulocytes: 0 %
Lymphocytes Relative: 14 %
Lymphs Abs: 1.2 10*3/uL (ref 0.7–4.0)
MCH: 24.8 pg — ABNORMAL LOW (ref 26.0–34.0)
MCHC: 29.4 g/dL — ABNORMAL LOW (ref 30.0–36.0)
MCV: 84.4 fL (ref 80.0–100.0)
Monocytes Absolute: 0.3 10*3/uL (ref 0.1–1.0)
Monocytes Relative: 3 %
Neutro Abs: 6.9 10*3/uL (ref 1.7–7.7)
Neutrophils Relative %: 82 %
Platelets: 274 10*3/uL (ref 150–400)
RBC: 3.59 MIL/uL — ABNORMAL LOW (ref 3.87–5.11)
RDW: 15.9 % — ABNORMAL HIGH (ref 11.5–15.5)
WBC: 8.4 10*3/uL (ref 4.0–10.5)
nRBC: 0 % (ref 0.0–0.2)

## 2022-03-03 LAB — BLOOD GAS, VENOUS
Acid-Base Excess: 2.3 mmol/L — ABNORMAL HIGH (ref 0.0–2.0)
Bicarbonate: 26.5 mmol/L (ref 20.0–28.0)
O2 Saturation: 74.9 %
Patient temperature: 37
pCO2, Ven: 39 mmHg — ABNORMAL LOW (ref 44–60)
pH, Ven: 7.44 — ABNORMAL HIGH (ref 7.25–7.43)
pO2, Ven: 46 mmHg — ABNORMAL HIGH (ref 32–45)

## 2022-03-03 LAB — RESP PANEL BY RT-PCR (FLU A&B, COVID) ARPGX2
Influenza A by PCR: NEGATIVE
Influenza B by PCR: NEGATIVE
SARS Coronavirus 2 by RT PCR: NEGATIVE

## 2022-03-03 LAB — HCG, QUANTITATIVE, PREGNANCY: hCG, Beta Chain, Quant, S: <1 m[IU]/mL (ref <5)

## 2022-03-03 MED ORDER — ALBUTEROL SULFATE HFA 108 (90 BASE) MCG/ACT IN AERS: 2.0000 | INHALATION_SPRAY | RESPIRATORY_TRACT | 0 refills | Status: DC | PRN

## 2022-03-03 MED ORDER — ALBUTEROL SULFATE (2.5 MG/3ML) 0.083% IN NEBU
5.0000 mg | INHALATION_SOLUTION | Freq: Once | RESPIRATORY_TRACT | Status: AC
  Administered 2022-03-03: 5 mg via RESPIRATORY_TRACT
  Filled 2022-03-03: qty 6

## 2022-03-03 MED ORDER — ALBUTEROL SULFATE (2.5 MG/3ML) 0.083% IN NEBU: 2.5000 mg | INHALATION_SOLUTION | RESPIRATORY_TRACT | 2 refills | Status: DC | PRN

## 2022-03-03 MED ORDER — PREDNISONE 10 MG PO TABS: 60.0000 mg | ORAL_TABLET | Freq: Every day | ORAL | 0 refills | Status: DC

## 2022-03-03 MED ORDER — ALBUTEROL SULFATE HFA 108 (90 BASE) MCG/ACT IN AERS
2.0000 | INHALATION_SPRAY | Freq: Once | RESPIRATORY_TRACT | Status: AC
  Administered 2022-03-03: 2 via RESPIRATORY_TRACT
  Filled 2022-03-03: qty 6.7

## 2022-03-03 MED ORDER — ALBUTEROL SULFATE (2.5 MG/3ML) 0.083% IN NEBU
2.5000 mg | INHALATION_SOLUTION | Freq: Once | RESPIRATORY_TRACT | Status: DC
Start: 2022-03-03 — End: 2022-03-03

## 2022-03-03 NOTE — ED Notes (Signed)
IV team at bedside 

## 2022-03-03 NOTE — ED Notes (Signed)
Pt ambulated around room with no c/o of SOB or dizziness. Oxygen Saturation remained between 97-100% on room air.  ?

## 2022-03-20 ENCOUNTER — Emergency Department
Admission: EM | Admit: 2022-03-20 | Discharge: 2022-03-20 | Discharge status: Against Medical Advice | Payer: BC Managed Care – PPO

## 2022-03-20 ENCOUNTER — Encounter: Payer: Self-pay | Admitting: Emergency Medicine

## 2022-03-20 DIAGNOSIS — N7689 Other specified inflammation of vagina and vulva: Secondary | ICD-10-CM | POA: Insufficient documentation

## 2022-03-20 DIAGNOSIS — J45909 Unspecified asthma, uncomplicated: Secondary | ICD-10-CM | POA: Insufficient documentation

## 2022-03-20 DIAGNOSIS — F458 Other somatoform disorders: Secondary | ICD-10-CM | POA: Diagnosis not present

## 2022-03-20 DIAGNOSIS — D649 Anemia, unspecified: Secondary | ICD-10-CM | POA: Insufficient documentation

## 2022-03-20 DIAGNOSIS — R300 Dysuria: Secondary | ICD-10-CM | POA: Insufficient documentation

## 2022-03-20 DIAGNOSIS — E119 Type 2 diabetes mellitus without complications: Secondary | ICD-10-CM | POA: Insufficient documentation

## 2022-03-20 DIAGNOSIS — N898 Other specified noninflammatory disorders of vagina: Secondary | ICD-10-CM | POA: Diagnosis not present

## 2022-03-20 DIAGNOSIS — R102 Pelvic and perineal pain: Secondary | ICD-10-CM | POA: Insufficient documentation

## 2022-03-20 LAB — COMPREHENSIVE METABOLIC PANEL
ALT: 53 U/L — ABNORMAL HIGH (ref 0–44)
AST: 18 U/L (ref 15–41)
Albumin: 3.7 g/dL (ref 3.5–5.0)
Alkaline Phosphatase: 50 U/L (ref 38–126)
Anion gap: 6 (ref 5–15)
BUN: 12 mg/dL (ref 6–20)
CO2: 25 mmol/L (ref 22–32)
Calcium: 9.3 mg/dL (ref 8.9–10.3)
Chloride: 109 mmol/L (ref 98–111)
Creatinine, Ser: 0.69 mg/dL (ref 0.44–1.00)
GFR, Estimated: >60 mL/min (ref >60)
Glucose, Bld: 105 mg/dL — ABNORMAL HIGH (ref 70–99)
Potassium: 3.7 mmol/L (ref 3.5–5.1)
Sodium: 140 mmol/L (ref 135–145)
Total Bilirubin: 0.5 mg/dL (ref 0.3–1.2)
Total Protein: 8.2 g/dL — ABNORMAL HIGH (ref 6.5–8.1)

## 2022-03-20 LAB — CBC WITH DIFFERENTIAL/PLATELET
Abs Immature Granulocytes: 0.03 10*3/uL (ref 0.00–0.07)
Basophils Absolute: 0 10*3/uL (ref 0.0–0.1)
Basophils Relative: 1 %
Eosinophils Absolute: 0.1 10*3/uL (ref 0.0–0.5)
Eosinophils Relative: 1 %
HCT: 32.5 % — ABNORMAL LOW (ref 36.0–46.0)
Hemoglobin: 9.7 g/dL — ABNORMAL LOW (ref 12.0–15.0)
Immature Granulocytes: 0 %
Lymphocytes Relative: 24 %
Lymphs Abs: 2 10*3/uL (ref 0.7–4.0)
MCH: 24.4 pg — ABNORMAL LOW (ref 26.0–34.0)
MCHC: 29.8 g/dL — ABNORMAL LOW (ref 30.0–36.0)
MCV: 81.9 fL (ref 80.0–100.0)
Monocytes Absolute: 0.6 10*3/uL (ref 0.1–1.0)
Monocytes Relative: 7 %
Neutro Abs: 5.7 10*3/uL (ref 1.7–7.7)
Neutrophils Relative %: 67 %
Platelets: 298 10*3/uL (ref 150–400)
RBC: 3.97 MIL/uL (ref 3.87–5.11)
RDW: 16.1 % — ABNORMAL HIGH (ref 11.5–15.5)
WBC: 8.4 10*3/uL (ref 4.0–10.5)
nRBC: 0 % (ref 0.0–0.2)

## 2022-03-20 LAB — POC URINE PREG, ED: Preg Test, Ur: NEGATIVE

## 2022-03-20 LAB — URINALYSIS, ROUTINE W REFLEX MICROSCOPIC
Bilirubin Urine: NEGATIVE
Glucose, UA: NEGATIVE mg/dL
Hgb urine dipstick: NEGATIVE
Ketones, ur: NEGATIVE mg/dL
Leukocytes,Ua: NEGATIVE
Nitrite: NEGATIVE
Protein, ur: NEGATIVE mg/dL
Specific Gravity, Urine: 1.028 (ref 1.005–1.030)
pH: 5 (ref 5.0–8.0)

## 2022-03-20 NOTE — ED Notes (Signed)
Lab called about obtaining labs - two staff members looked for attempts.

## 2022-03-20 NOTE — ED Notes (Signed)
Per patient no longer wishes to wait, will return if worsening sx's.

## 2022-03-20 NOTE — ED Triage Notes (Signed)
Pt presents via POV with complaints of vaginal pain with associated lower back pain. Pt denies taking any medication PTA. She has tried the OTC Azo for UTIs without improvement and has had mild burning with urination. Denies N/V.

## 2022-03-21 ENCOUNTER — Emergency Department
Admission: EM | Admit: 2022-03-21 | Discharge: 2022-03-21 | Disposition: A | Discharge status: Home / Self Care | Payer: BC Managed Care – PPO | Attending: Emergency Medicine | Admitting: Emergency Medicine

## 2022-03-21 DIAGNOSIS — N898 Other specified noninflammatory disorders of vagina: Secondary | ICD-10-CM

## 2022-03-21 LAB — WET PREP, GENITAL
Clue Cells Wet Prep HPF POC: NONE SEEN
Sperm: NONE SEEN
Trich, Wet Prep: NONE SEEN
WBC, Wet Prep HPF POC: <10 (ref <10)
Yeast Wet Prep HPF POC: NONE SEEN

## 2022-03-21 LAB — CHLAMYDIA/NGC RT PCR (ARMC ONLY)
Chlamydia Tr: NOT DETECTED
N gonorrhoeae: NOT DETECTED

## 2022-03-21 NOTE — Discharge Instructions (Signed)
You can try monistat cream for 3 to 5 days. You can find that in any pharmacy and it is over the counter. Follow up with your doctor in 2 days. Return to the ER if symptoms don't resolve, if you develop new or worsening pain or fever.

## 2022-03-21 NOTE — ED Provider Notes (Signed)
[facility] Provider Note    Event Date/Time   First MD Initiated Contact with Patient 03/21/22 0235     (approximate)   History   Vaginal Pain   HPI  Sabrina Bell is a 21 y.o. female with a history of morbid obesity, diabetes who presents for evaluation of vaginal pain.  Patient reports 2 weeks of vaginal pain and dysuria.  Patient reports that her pain is only present when she urinates.  She feels that the pain starts in her vagina, then go to the suprapubic region and then radiates across to her back.  She denies any vaginal discharge.  No prior history of STDs.  She has been taking Azo's at home without improvement.  No abdominal pain, fever or chills.     Past Medical History:  Diagnosis Date   Asthma    Borderline diabetes    Depression    Diabetes mellitus without complication ([place])    Migraines    Obesity     History reviewed. No pertinent surgical history.   Physical Exam   Triage Vital Signs: ED Triage Vitals  Enc Vitals Group     BP 03/20/22 2325 (!) 136/58     Pulse Rate 03/20/22 2325 81     Resp 03/20/22 2325 18     Temp 03/20/22 2325 98.7 F (37.1 C)     Temp src --      SpO2 03/20/22 2325 98 %     Weight 03/20/22 2323 (!) 425 lb (192.8 kg)     Height 03/20/22 2323 '5\' 7"'$  (1.702 m)     Head Circumference --      Peak Flow --      Pain Score 03/20/22 2323 7     Pain Loc --      Pain Edu? --      Excl. in [place]? --     Most recent vital signs: Vitals:   03/20/22 2325 03/21/22 0305  BP: (!) 136/58 130/74  Pulse: 81 88  Resp: 18 16  Temp: 98.7 F (37.1 C)   SpO2: 98% 100%     Constitutional: Alert and oriented. Well appearing and in no apparent distress. HEENT:      Head: Normocephalic and atraumatic.         Eyes: Conjunctivae are normal. Sclera is non-icteric.       Mouth/Throat: Mucous membranes are moist.       Neck: Supple with no signs of meningismus. Cardiovascular: Regular rate and rhythm. No murmurs,  gallops, or rubs. 2+ symmetrical distal pulses are present in all extremities.  Respiratory: Normal respiratory effort. Lungs are clear to auscultation bilaterally.  Gastrointestinal: Soft, non tender, and non distended with positive bowel sounds. No rebound or guarding. Pelvic exam: Normal external genitalia, no rashes or lesions. Normal cervical mucus. Os closed. No cervical motion tenderness.  No uterine or adnexal tenderness.   Musculoskeletal:  No edema, cyanosis, or erythema of extremities. Neurologic: Normal speech and language. Face is symmetric. Moving all extremities. No gross focal neurologic deficits are appreciated. Skin: Skin is warm, dry and intact. No rash noted. Psychiatric: Mood and affect are normal. Speech and behavior are normal.  ED Results / Procedures / Treatments   Labs (all labs ordered are listed, but only abnormal results are displayed) Labs Reviewed  URINALYSIS, ROUTINE W REFLEX MICROSCOPIC - Abnormal; Notable for the following components:      Result Value   Color, Urine YELLOW (*)  APPearance HAZY (*)    All other components within normal limits  CBC WITH DIFFERENTIAL/PLATELET - Abnormal; Notable for the following components:   Hemoglobin 9.7 (*)    HCT 32.5 (*)    MCH 24.4 (*)    MCHC 29.8 (*)    RDW 16.1 (*)    All other components within normal limits  COMPREHENSIVE METABOLIC PANEL - Abnormal; Notable for the following components:   Glucose, Bld 105 (*)    Total Protein 8.2 (*)    ALT 53 (*)    All other components within normal limits  WET PREP, GENITAL  CHLAMYDIA/NGC RT PCR ([facility] ONLY)            POC URINE PREG, ED     EKG  none   RADIOLOGY none   PROCEDURES:  Critical Care performed: No  Procedures    IMPRESSION / MDM / ASSESSMENT AND PLAN / ED COURSE  I reviewed the triage vital signs and the nursing notes.  21 y.o. female with a history of morbid obesity, diabetes who presents for evaluation of vaginal pain and dysuria  for 2 weeks.  On exam she is well-appearing in no distress, no abdominal tenderness, pelvic exam showing no CMT or adnexal tenderness, small amount of discharge with some redness of the vaginal mucosa.  Differential diagnoses including STD versus yeast infection.  UA with no signs of UTI.  Wet prep, GC chlamydia pending.  Labs with no signs of sepsis or leukocytosis, mild stable anemia, normal metabolic panel with no signs of DKA.  Pregnancy test is negative.   _________________________ 4:03 AM on 03/21/2022 ----------------------------------------- Prep is negative but will treat with Monistat based on exam and see if patient has any relief.  Recommended close follow-up with her primary care doctor in 2 to 3 days.  Or return to the hospital if symptoms do not improve, she develops new or worsening pain or fever.  MEDICATIONS GIVEN IN ED: Medications - No data to display   EMR reviewed none    FINAL CLINICAL IMPRESSION(S) / ED DIAGNOSES   Final diagnoses:  Vaginal irritation     Rx / DC Orders   ED Discharge Orders     None        Note:  This document was prepared using Dragon voice recognition software and may include unintentional dictation errors.   Please note:  Patient was evaluated in Emergency Department today for the symptoms described in the history of present illness. Patient was evaluated in the context of the global COVID-19 pandemic, which necessitated consideration that the patient might be at risk for infection with the SARS-CoV-2 virus that causes COVID-19. Institutional protocols and algorithms that pertain to the evaluation of patients at risk for COVID-19 are in a state of rapid change based on information released by regulatory bodies including the CDC and federal and state organizations. These policies and algorithms were followed during the patient's care in the ED.  Some ED evaluations and interventions may be delayed as a result of limited staffing during  the pandemic.       Alfred Levins, [place], MD 03/21/22 [id number]

## 2022-04-11 IMAGING — CR DG NECK SOFT TISSUE
2 series · 2 of 2 positions shown · non-contrast
Comparison: None.

CLINICAL DATA: 19-year-old female with sore throat and muffled
voice. Rule out epiglottitis.

EXAM:
NECK SOFT TISSUES - 1+ VIEW

[neck lat]
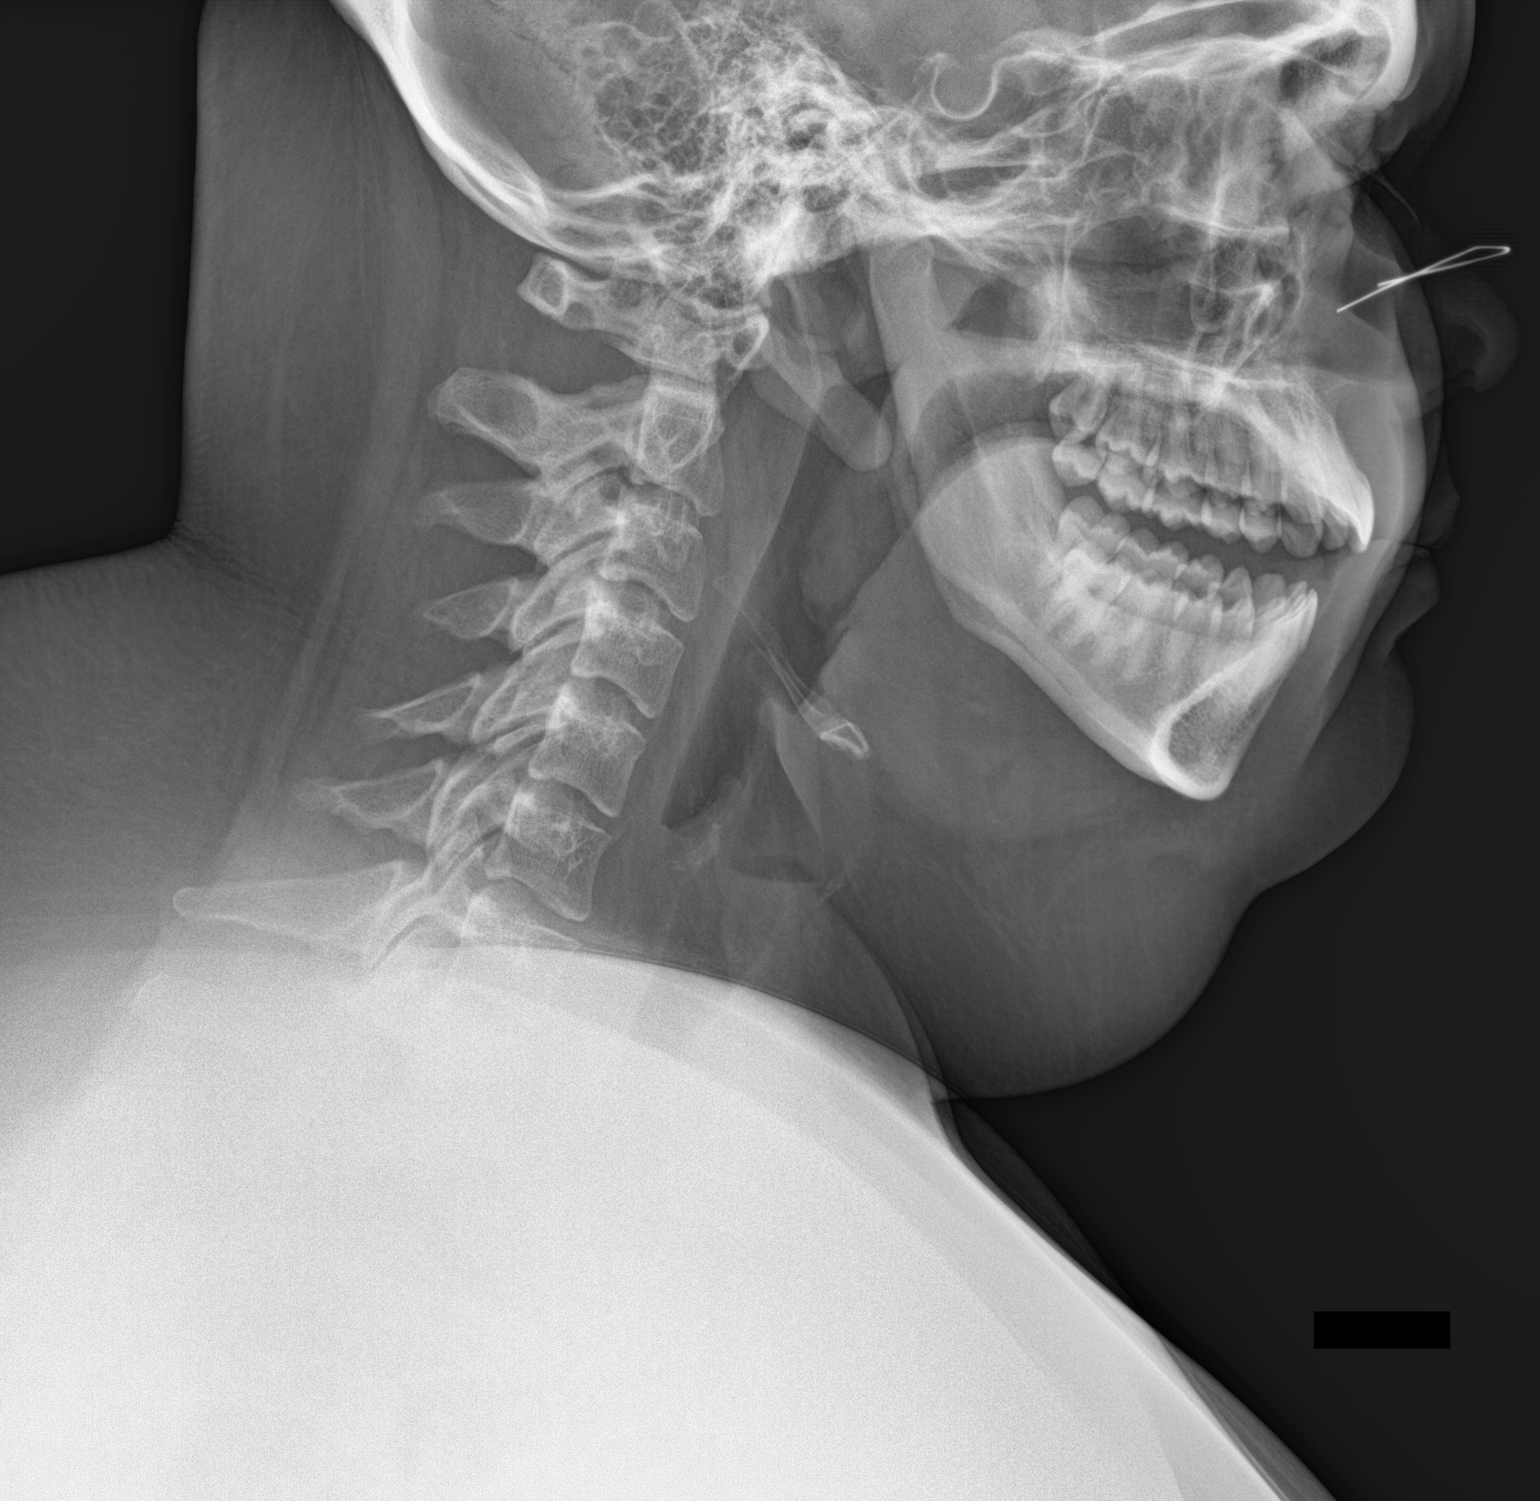

[neck ap]
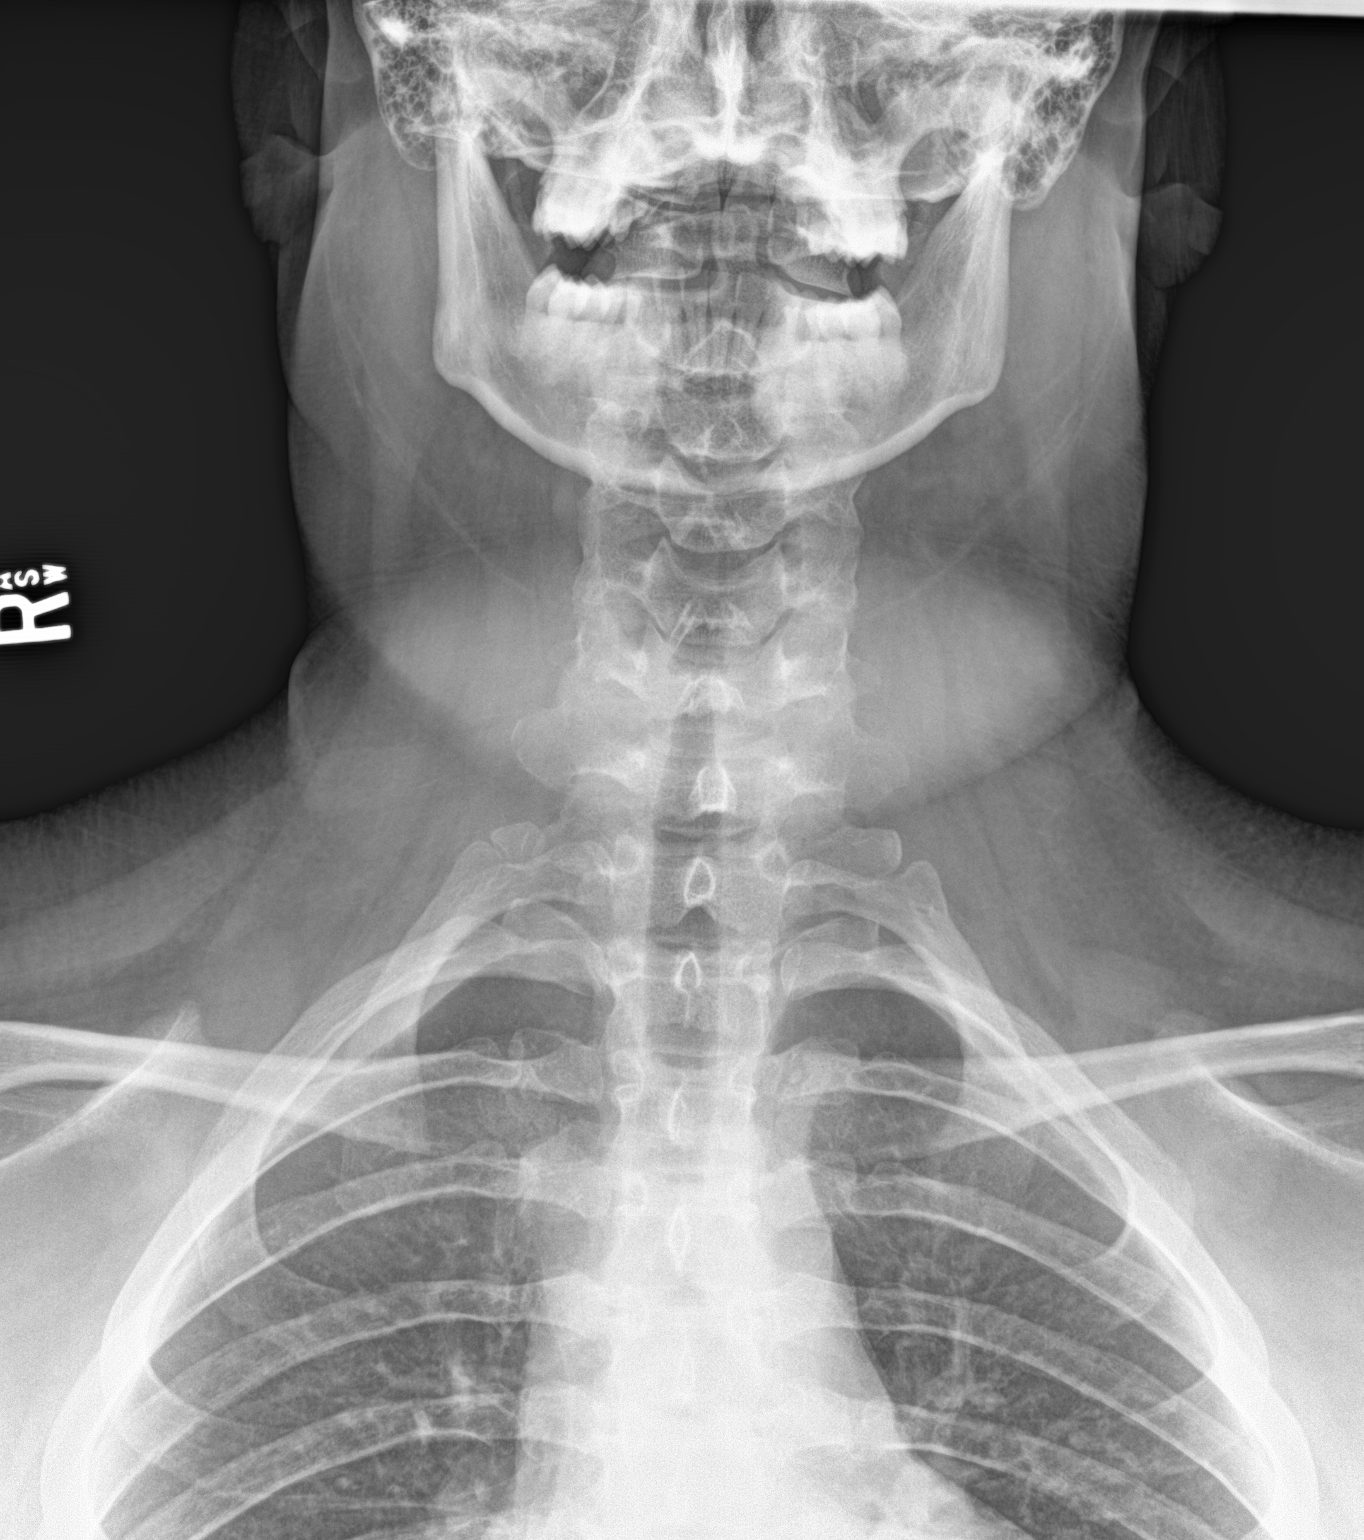

[2 of 2 positions shown; findings below may reference images not displayed]

FINDINGS: There is no evidence of retropharyngeal soft tissue swelling or
epiglottic enlargement. The cervical airway is unremarkable and no
radio-opaque foreign body identified.
IMPRESSION: Negative.

## 2022-04-22 ENCOUNTER — Emergency Department: Payer: BC Managed Care – PPO

## 2022-04-22 ENCOUNTER — Other Ambulatory Visit: Payer: Self-pay

## 2022-04-22 DIAGNOSIS — M549 Dorsalgia, unspecified: Secondary | ICD-10-CM | POA: Diagnosis not present

## 2022-04-22 DIAGNOSIS — E119 Type 2 diabetes mellitus without complications: Secondary | ICD-10-CM | POA: Diagnosis not present

## 2022-04-22 DIAGNOSIS — K760 Fatty (change of) liver, not elsewhere classified: Secondary | ICD-10-CM | POA: Diagnosis not present

## 2022-04-22 DIAGNOSIS — J45909 Unspecified asthma, uncomplicated: Secondary | ICD-10-CM | POA: Insufficient documentation

## 2022-04-22 DIAGNOSIS — R162 Hepatomegaly with splenomegaly, not elsewhere classified: Secondary | ICD-10-CM | POA: Diagnosis not present

## 2022-04-22 DIAGNOSIS — M545 Low back pain, unspecified: Secondary | ICD-10-CM | POA: Diagnosis not present

## 2022-04-22 DIAGNOSIS — N200 Calculus of kidney: Secondary | ICD-10-CM | POA: Diagnosis not present

## 2022-04-22 DIAGNOSIS — I10 Essential (primary) hypertension: Secondary | ICD-10-CM | POA: Insufficient documentation

## 2022-04-22 DIAGNOSIS — K429 Umbilical hernia without obstruction or gangrene: Secondary | ICD-10-CM | POA: Diagnosis not present

## 2022-04-22 LAB — URINALYSIS, ROUTINE W REFLEX MICROSCOPIC
Bacteria, UA: NONE SEEN
Bilirubin Urine: NEGATIVE
Glucose, UA: NEGATIVE mg/dL
Ketones, ur: NEGATIVE mg/dL
Leukocytes,Ua: NEGATIVE
Nitrite: NEGATIVE
Protein, ur: NEGATIVE mg/dL
Specific Gravity, Urine: 1.026 (ref 1.005–1.030)
pH: 5 (ref 5.0–8.0)

## 2022-04-22 LAB — POC URINE PREG, ED: Preg Test, Ur: NEGATIVE

## 2022-04-22 NOTE — ED Triage Notes (Signed)
Pt presents to ER from home c/o lower back pain x2 months.  Pain becomes worse with movement/activity, and is worse at night.  Pt states her s/o attempted to "adjust" her back by pressing on it, but had no relief.  Pt states pain radiates down to her legs. Pt did do some heavy lifting yesterday while moving some boxes and thinks she might have strained a muscle.  Pt is A&O x4 at this time in NAD in triage.

## 2022-04-23 ENCOUNTER — Emergency Department: Payer: BC Managed Care – PPO

## 2022-04-23 ENCOUNTER — Emergency Department
Admission: EM | Admit: 2022-04-23 | Discharge: 2022-04-23 | Disposition: A | Discharge status: Home / Self Care | Payer: BC Managed Care – PPO | Attending: Emergency Medicine | Admitting: Emergency Medicine

## 2022-04-23 DIAGNOSIS — R162 Hepatomegaly with splenomegaly, not elsewhere classified: Secondary | ICD-10-CM | POA: Diagnosis not present

## 2022-04-23 DIAGNOSIS — M545 Low back pain, unspecified: Secondary | ICD-10-CM

## 2022-04-23 DIAGNOSIS — K760 Fatty (change of) liver, not elsewhere classified: Secondary | ICD-10-CM | POA: Diagnosis not present

## 2022-04-23 DIAGNOSIS — N200 Calculus of kidney: Secondary | ICD-10-CM | POA: Diagnosis not present

## 2022-04-23 DIAGNOSIS — K429 Umbilical hernia without obstruction or gangrene: Secondary | ICD-10-CM | POA: Diagnosis not present

## 2022-04-23 MED ORDER — IBUPROFEN 400 MG PO TABS
400.0000 mg | ORAL_TABLET | Freq: Once | ORAL | Status: AC
  Administered 2022-04-23: 400 mg via ORAL
  Filled 2022-04-23: qty 1

## 2022-04-23 MED ORDER — OXYCODONE-ACETAMINOPHEN 5-325 MG PO TABS
1.0000 | ORAL_TABLET | Freq: Once | ORAL | Status: AC
  Administered 2022-04-23: 1 via ORAL
  Filled 2022-04-23: qty 1

## 2022-04-23 NOTE — ED Provider Notes (Signed)
[facility] Provider Note    Event Date/Time   First MD Initiated Contact with Patient 04/23/22 [phone]     (approximate)   History   Back Pain   HPI  Sabrina Bell is a 21 y.o. female with past medical history of asthma diabetes and obesity presents with back pain.  Patient notes that pain has been going on for about 2 months.  There is no exacerbating injury.  Pain is in the low back radiates to bilateral sides.  Has been daily.  Worse over the last day.  Sometimes it radiates into the legs but not currently.  Denies numbness weakness bowel bladder incontinence fevers.  Denies abdominal pain urinary symptoms hematuria.  Has been taking ibuprofen for it.  Her boyfriend notes that he was trying to manipulate her back tonight and he felt a pop she felt the pain was worse after that.    Past Medical History:  Diagnosis Date   Asthma    Borderline diabetes    Depression    Diabetes mellitus without complication ([place])    Migraines    Obesity     Patient Active Problem List   Diagnosis Date Noted   Iron deficiency 05/17/2020   Chronic iron deficiency anemia 02/03/2020   Prediabetes 02/03/2020   Asthma exacerbation 02/03/2020   Acute exacerbation of severe persistent extrinsic asthma 01/28/2020   Asthma 08/13/2019   Abnormal abdominal ultrasound 08/13/2019   Menorrhagia 08/10/2019   Abdominal pain 08/10/2019   Status asthmaticus 11/05/2018   Amenorrhea 11/05/2018   Social isolation 11/05/2018   Educational circumstances 11/05/2018   OSA (obstructive sleep apnea) 05/27/2017   Hypersomnia    Morbid obesity with BMI of 70 and over, adult [facility])    Essential hypertension    Acanthosis nigricans, acquired    Dyspepsia    Anemia    Adjustment reaction with anxiety and depression    Acute respiratory failure with hypoxia (HCC)    Insomnia 11/29/2015   Generalized anxiety disorder 11/29/2015   MDD (major depressive disorder), recurrent episode, severe  ([place]) 11/28/2015     Physical Exam  Triage Vital Signs: ED Triage Vitals [04/22/22 2216]  Enc Vitals Group     BP (!) 149/88     Pulse Rate 88     Resp 18     Temp 98.6 F (37 C)     Temp Source Oral     SpO2 98 %     Weight      Height      Head Circumference      Peak Flow      Pain Score 7     Pain Loc      Pain Edu?      Excl. in [place]?     Most recent vital signs: Vitals:   04/22/22 2216  BP: (!) 149/88  Pulse: 88  Resp: 18  Temp: 98.6 F (37 C)  SpO2: 98%     General: Awake, no distress.  Patient is obese CV:  Good peripheral perfusion.  Resp:  Normal effort.  Abd:  No distention.  Neuro:             Awake, Alert, Oriented x 3  Other:  Midline and bilateral paraspinal lumbar tenderness 5 out of 5 strength with hip flexion plantarflexion dorsiflexion bilaterally Sensation grossly intact in bilateral lower extremities   ED Results / Procedures / Treatments  Labs (all labs ordered are listed, but only abnormal results are  displayed) Labs Reviewed  URINALYSIS, ROUTINE W REFLEX MICROSCOPIC - Abnormal; Notable for the following components:      Result Value   Color, Urine YELLOW (*)    APPearance HAZY (*)    Hgb urine dipstick MODERATE (*)    All other components within normal limits  POC URINE PREG, ED - Normal     EKG    RADIOLOGY    PROCEDURES:  Critical Care performed: No  Procedures   MEDICATIONS ORDERED IN ED: Medications  ibuprofen (ADVIL) tablet 400 mg (400 mg Oral Given 04/23/22 0102)  oxyCODONE-acetaminophen (PERCOCET/ROXICET) 5-325 MG per tablet 1 tablet (1 tablet Oral Given 04/23/22 0102)     IMPRESSION / MDM / ASSESSMENT AND PLAN / ED COURSE  I reviewed the triage vital signs and the nursing notes.                              Patient's presentation is most consistent with acute complicated illness / injury requiring diagnostic workup.  Differential diagnosis includes, but is not limited to, DJD, lumbar strain, kidney  stone, less likely cauda equina syndrome, spinal epidural abscess  Is a 21 year old female who is morbidly obese who presents with back pain has been going on for 2 months.  It is midline and paraspinal bilaterally without associated radicular symptoms.  No red flag symptoms for cauda equina syndrome including bowel bladder incontinence numbness weakness in the legs.  She has no fevers and no history of immunosuppression or IV drug use.  Vital signs are notable for mild hypertension but otherwise within normal limits.  She has full strength and sensation is normal in her extremities she is tender both in the lumbar midline and paraspinal region.  Abdomen is nontender.  UA was obtained from triage does have hematuria patient not on her menstrual period.  My suspicion for stone causing this is less likely given the time course however with her worsening pain hematuria will obtain CT renal study to ensure kidney stone is not the cause of her pain today.  Pregnancy test is negative.  We will treat with ibuprofen and Percocet.   CT renal study is negative for obstructing stone.  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  No other acute findings.  She is appropriate for discharge.   FINAL CLINICAL IMPRESSION(S) / ED DIAGNOSES   Final diagnoses:  Low back pain, unspecified back pain laterality, unspecified chronicity, unspecified whether sciatica present     Rx / DC Orders   ED Discharge Orders     None        Note:  This document was prepared using Dragon voice recognition software and may include unintentional dictation errors.   Rada Hay, MD 04/23/22 [phone]

## 2022-04-23 NOTE — ED Notes (Signed)
Pt DC to home. DC instructions reviewed with all questions answered. Pt verbalizes understanding. Pt out of dept via wheelchair with significant other.

## 2022-04-26 ENCOUNTER — Ambulatory Visit: Payer: BC Managed Care – PPO | Admitting: Obstetrics and Gynecology

## 2022-05-16 ENCOUNTER — Encounter: Payer: Self-pay | Admitting: Emergency Medicine

## 2022-05-16 ENCOUNTER — Emergency Department
Admission: EM | Admit: 2022-05-16 | Discharge: 2022-05-16 | Disposition: A | Discharge status: Home / Self Care | Payer: BC Managed Care – PPO | Attending: Emergency Medicine | Admitting: Emergency Medicine

## 2022-05-16 DIAGNOSIS — I10 Essential (primary) hypertension: Secondary | ICD-10-CM | POA: Insufficient documentation

## 2022-05-16 DIAGNOSIS — J45909 Unspecified asthma, uncomplicated: Secondary | ICD-10-CM | POA: Insufficient documentation

## 2022-05-16 DIAGNOSIS — R7309 Other abnormal glucose: Secondary | ICD-10-CM | POA: Insufficient documentation

## 2022-05-16 DIAGNOSIS — R55 Syncope and collapse: Secondary | ICD-10-CM | POA: Insufficient documentation

## 2022-05-16 DIAGNOSIS — R42 Dizziness and giddiness: Secondary | ICD-10-CM | POA: Diagnosis not present

## 2022-05-16 LAB — URINALYSIS, ROUTINE W REFLEX MICROSCOPIC
Bilirubin Urine: NEGATIVE
Glucose, UA: NEGATIVE mg/dL
Hgb urine dipstick: NEGATIVE
Ketones, ur: NEGATIVE mg/dL
Leukocytes,Ua: NEGATIVE
Nitrite: NEGATIVE
Protein, ur: 30 mg/dL — AB
Specific Gravity, Urine: 1.029 (ref 1.005–1.030)
pH: 5 (ref 5.0–8.0)

## 2022-05-16 LAB — BASIC METABOLIC PANEL
Anion gap: 9 (ref 5–15)
BUN: 13 mg/dL (ref 6–20)
CO2: 22 mmol/L (ref 22–32)
Calcium: 8.7 mg/dL — ABNORMAL LOW (ref 8.9–10.3)
Chloride: 107 mmol/L (ref 98–111)
Creatinine, Ser: 0.69 mg/dL (ref 0.44–1.00)
GFR, Estimated: >60 mL/min (ref >60)
Glucose, Bld: 120 mg/dL — ABNORMAL HIGH (ref 70–99)
Potassium: 3.9 mmol/L (ref 3.5–5.1)
Sodium: 138 mmol/L (ref 135–145)

## 2022-05-16 LAB — CBC
HCT: 31.3 % — ABNORMAL LOW (ref 36.0–46.0)
Hemoglobin: 9 g/dL — ABNORMAL LOW (ref 12.0–15.0)
MCH: 24.1 pg — ABNORMAL LOW (ref 26.0–34.0)
MCHC: 28.8 g/dL — ABNORMAL LOW (ref 30.0–36.0)
MCV: 83.9 fL (ref 80.0–100.0)
Platelets: 301 10*3/uL (ref 150–400)
RBC: 3.73 MIL/uL — ABNORMAL LOW (ref 3.87–5.11)
RDW: 16.2 % — ABNORMAL HIGH (ref 11.5–15.5)
WBC: 9.4 10*3/uL (ref 4.0–10.5)
nRBC: 0 % (ref 0.0–0.2)

## 2022-05-16 LAB — CBG MONITORING, ED: Glucose-Capillary: 127 mg/dL — ABNORMAL HIGH (ref 70–99)

## 2022-05-16 LAB — TROPONIN I (HIGH SENSITIVITY): Troponin I (High Sensitivity): 2 ng/L (ref <18)

## 2022-05-16 LAB — POC URINE PREG, ED: Preg Test, Ur: NEGATIVE

## 2022-05-16 NOTE — ED Notes (Signed)
ED Provider at bedside. 

## 2022-05-16 NOTE — ED Provider Notes (Signed)
[facility] Provider Note    Event Date/Time   First MD Initiated Contact with Patient 05/16/22 [phone]     (approximate)   History   Loss of Consciousness   HPI  Sabrina Bell is a 21 y.o. female who tells me she has no medical history but who is medical record indicates a history that includes morbid obesity, hypertension, sleep apnea, prior history of asthma, etc.  She presents for evaluation of what is described as passing out.  She reports that she was playing video games while sitting on the couch with her family and she all of a sudden passed out.  However she provided additional details to the nurse that she was laughing hard while doing so and that she may have been holding her breath.  She did not have any twitching or shaking episodes and return to normal almost immediately.  EMS was called and her vital signs were stable and within normal limits.  She thought she should probably get checked out.  She denies any pain including chest pain and abdominal pain.  She has had no headache and no trauma as result of passing out while sitting on the couch.  No nausea, vomiting, nor diarrhea.  No recent dysuria.  No recent medication changes.     Physical Exam   Triage Vital Signs: ED Triage Vitals  Enc Vitals Group     BP 05/16/22 0231 114/76     Pulse Rate 05/16/22 0231 98     Resp 05/16/22 0231 18     Temp 05/16/22 0231 98.7 F (37.1 C)     Temp Source 05/16/22 0231 Oral     SpO2 05/16/22 0231 99 %     Weight 05/16/22 0528 (!) 230.4 kg (508 lb)     Height --      Head Circumference --      Peak Flow --      Pain Score --      Pain Loc --      Pain Edu? --      Excl. in [place]? --     Most recent vital signs: Vitals:   05/16/22 0600 05/16/22 0630  BP: (!) 138/50 133/75  Pulse: 86 75  Resp: 18 18  Temp:  98.4 F (36.9 C)  SpO2: 93% 96%     General: Awake, no distress.  CV:  Good peripheral perfusion.  Normal heart sounds.  Regular rate  and rhythm. Resp:  Normal effort.  Lungs are clear to auscultation bilaterally. Abd:  Morbid obesity with a BMI of about 79.  No tenderness to palpation of the abdomen.   Other:  Normal mentation.  No confusion.  Normal mood and affect.   ED Results / Procedures / Treatments   Labs (all labs ordered are listed, but only abnormal results are displayed) Labs Reviewed  BASIC METABOLIC PANEL - Abnormal; Notable for the following components:      Result Value   Glucose, Bld 120 (*)    Calcium 8.7 (*)    All other components within normal limits  CBC - Abnormal; Notable for the following components:   RBC 3.73 (*)    Hemoglobin 9.0 (*)    HCT 31.3 (*)    MCH 24.1 (*)    MCHC 28.8 (*)    RDW 16.2 (*)    All other components within normal limits  URINALYSIS, ROUTINE W REFLEX MICROSCOPIC - Abnormal; Notable for the following components:   Color, Urine  YELLOW (*)    APPearance CLOUDY (*)    Protein, ur 30 (*)    Bacteria, UA RARE (*)    All other components within normal limits  CBG MONITORING, ED - Abnormal; Notable for the following components:   Glucose-Capillary 127 (*)    All other components within normal limits  POC URINE PREG, ED  TROPONIN I (HIGH SENSITIVITY)     EKG  ED ECG REPORT I, Hinda Kehr, the attending physician, personally viewed and interpreted this ECG.  Date: 05/16/2022 EKG Time: 00: 23 Rate: 101 Rhythm: Sinus tachycardia QRS Axis: normal Intervals: normal ST/T Wave abnormalities: Non-specific ST segment / T-wave changes, but no clear evidence of acute ischemia. Narrative Interpretation: no definitive evidence of acute ischemia; does not meet STEMI criteria.   Marland Kitchen1-3 Lead EKG Interpretation  Performed by: Hinda Kehr, MD Authorized by: Hinda Kehr, MD     Interpretation: normal     ECG rate:  69   ECG rate assessment: normal     Rhythm: sinus rhythm     Ectopy: none     Conduction: normal       IMPRESSION / MDM / ASSESSMENT AND PLAN /  ED COURSE  I reviewed the triage vital signs and the nursing notes.                              Differential diagnosis includes, but is not limited to, vasovagal episode, orthostatic syncope, cardiac arrhythmia, electrolyte or metabolic abnormality.  Patient's presentation is most consistent with acute presentation with potential threat to life or bodily function.  However, the patient is generally well-appearing and in no distress.  Vital signs are stable and within normal limits; initially she was very slightly tachycardic but that completely resolved and she has had a heart rate in the 60s and 70s since that time.  Labs/studies ordered include EKG, urinalysis, CBG, basic metabolic panel, urine pregnancy test, high-sensitivity troponin.  Urine appears slightly contaminated with squamous cells but shows no indication of acute infection and she is having no dysuria.  Urine pregnancy test is negative.  Basic metabolic panel and CBC are essentially normal other than anemia which is chronic and essentially unchanged.  It sounds as if she would briefly hold her breath at home while laughing and playing video games.  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  There is no indication that this was a cardiac event.  Although she is anemic, her hematocrit is greater than 30 and she is low risk based on the [facility] syncope rule.  She had an extended period of observation in the emergency department due to overwhelming ED patient volume, and she has had no repeat events.  She is in no distress.  I considered hospitalization initially based on the chief complaint and initial question of possible cardiac arrhythmia, but I am reassured after the work-up that she is not experiencing an acute emergent medical condition and that she is at low risk of bad outcome.  The patient is  comfortable with the plan for discharge and outpatient follow-up and I gave my usual and customary return precautions.  The patient was on the cardiac monitor to evaluate for evidence of arrhythmia and/or significant heart rate changes.      FINAL CLINICAL IMPRESSION(S) / ED DIAGNOSES   Final diagnoses:  Syncope, unspecified syncope type     Rx / DC Orders   ED Discharge Orders          Ordered    Ambulatory referral to Cardiology       Comments: If you have not heard from the Cardiology office within the next 72 hours please call [phone].   05/16/22 3545             Note:  This document was prepared using Dragon voice recognition software and may include unintentional dictation errors.   Hinda Kehr, MD 05/16/22 [phone]

## 2022-05-16 NOTE — ED Triage Notes (Signed)
Pt arrived via [facility] from home where she was sitting on couch when her family seen her pass out for "seconds." Pt with hx/o DM, CBG with EMS 121. Pt denies recent illness and denies symptoms prior to syncopal episode. Pt A&O x4 on arrival and answering questions appropriately. Pt denies hitting head, denies current pain.

## 2022-05-16 NOTE — ED Notes (Signed)
Pt brought to ED rm 6 at this time, this RN now assuming care.

## 2022-05-16 NOTE — Discharge Instructions (Signed)

## 2022-05-21 ENCOUNTER — Emergency Department
Admission: EM | Admit: 2022-05-21 | Discharge: 2022-05-21 | Disposition: A | Discharge status: Home / Self Care | Payer: BC Managed Care – PPO | Attending: Emergency Medicine | Admitting: Emergency Medicine

## 2022-05-21 ENCOUNTER — Encounter: Payer: Self-pay | Admitting: Emergency Medicine

## 2022-05-21 ENCOUNTER — Other Ambulatory Visit: Payer: Self-pay

## 2022-05-21 ENCOUNTER — Telehealth: Payer: Self-pay | Admitting: Cardiology

## 2022-05-21 ENCOUNTER — Emergency Department: Payer: BC Managed Care – PPO

## 2022-05-21 DIAGNOSIS — R0789 Other chest pain: Secondary | ICD-10-CM

## 2022-05-21 DIAGNOSIS — J45909 Unspecified asthma, uncomplicated: Secondary | ICD-10-CM | POA: Diagnosis not present

## 2022-05-21 DIAGNOSIS — R55 Syncope and collapse: Secondary | ICD-10-CM | POA: Insufficient documentation

## 2022-05-21 DIAGNOSIS — E119 Type 2 diabetes mellitus without complications: Secondary | ICD-10-CM | POA: Diagnosis not present

## 2022-05-21 DIAGNOSIS — R079 Chest pain, unspecified: Secondary | ICD-10-CM | POA: Diagnosis not present

## 2022-05-21 DIAGNOSIS — I10 Essential (primary) hypertension: Secondary | ICD-10-CM | POA: Insufficient documentation

## 2022-05-21 LAB — BASIC METABOLIC PANEL
Anion gap: 9 (ref 5–15)
BUN: 15 mg/dL (ref 6–20)
CO2: 25 mmol/L (ref 22–32)
Calcium: 9.4 mg/dL (ref 8.9–10.3)
Chloride: 104 mmol/L (ref 98–111)
Creatinine, Ser: 0.52 mg/dL (ref 0.44–1.00)
GFR, Estimated: >60 mL/min (ref >60)
Glucose, Bld: 92 mg/dL (ref 70–99)
Potassium: 3.9 mmol/L (ref 3.5–5.1)
Sodium: 138 mmol/L (ref 135–145)

## 2022-05-21 LAB — CBC
HCT: 33.4 % — ABNORMAL LOW (ref 36.0–46.0)
Hemoglobin: 9.7 g/dL — ABNORMAL LOW (ref 12.0–15.0)
MCH: 24.2 pg — ABNORMAL LOW (ref 26.0–34.0)
MCHC: 29 g/dL — ABNORMAL LOW (ref 30.0–36.0)
MCV: 83.3 fL (ref 80.0–100.0)
Platelets: 287 10*3/uL (ref 150–400)
RBC: 4.01 MIL/uL (ref 3.87–5.11)
RDW: 15.9 % — ABNORMAL HIGH (ref 11.5–15.5)
WBC: 10.2 10*3/uL (ref 4.0–10.5)
nRBC: 0 % (ref 0.0–0.2)

## 2022-05-21 LAB — TROPONIN I (HIGH SENSITIVITY): Troponin I (High Sensitivity): <2 ng/L (ref <18)

## 2022-05-21 NOTE — Telephone Encounter (Signed)
Pt c/o of Chest Pain: STAT if CP now or developed within 24 hours  1. Are you having CP right now? Yes   2. Are you experiencing any other symptoms (ex. SOB, nausea, vomiting, sweating)? No; pain on left side under breast  3. How long have you been experiencing CP? Today   4. Is your CP continuous or coming and going? continuous  5. Have you taken Nitroglycerin? No  ?

## 2022-05-21 NOTE — ED Provider Triage Note (Signed)
  Emergency Medicine Provider Triage Evaluation Note  Sabrina Bell , a 21 y.o.female,  was evaluated in triage.  Pt complains of central chest pain that started approximately 1.5 hours ago.  She was reportedly seen here last week with similar symptoms and describes feeling very anxious.  She has an appointment with a cardiologist to review a troponin level that was reportedly high.   Review of Systems  Positive: Chest pain Negative: Denies fever, abdominal pain, vomiting  Physical Exam   Vitals:   05/21/22 1709  BP: 136/87  Pulse: 90  Resp: 16  Temp: 98.2 F (36.8 C)  SpO2: 99%   Gen:   Awake, no distress   Resp:  Normal effort  MSK:   Moves extremities without difficulty  Other:    Medical Decision Making  Given the patient's initial medical screening exam, the following diagnostic evaluation has been ordered. The patient will be placed in the appropriate treatment space, once one is available, to complete the evaluation and treatment. I have discussed the plan of care with the patient and I have advised the patient that an ED physician or mid-level practitioner will reevaluate their condition after the test results have been received, as the results may give them additional insight into the type of treatment they may need.    Diagnostics: Labs, EKG, CXR  Treatments: none immediately   Teodoro Spray, [place] 05/21/22 1744

## 2022-05-21 NOTE — Telephone Encounter (Signed)
Returned call to pt she states that about 1 hour ago she received a scheduling call to schedule an "urgent" appointment with Dr Ellyn Hack. Appt was scheduled. When she hung up the phone, she immediately started having chest pain with pain under her left breast. She states that these pains lasted about an hour.Pt denies syncope, nausea, SOB or any other sx. Pt does not have a BP cuff, she does not have nitro rx to take. I heard someone in the background and ask who that was, she replied her Mother and Father. Informed pt to have them take to to the ER, she states that Father will take her [facility] for cardiac evaliation.

## 2022-05-21 NOTE — ED Provider Notes (Signed)
[facility] Provider Note    Event Date/Time   First MD Initiated Contact with Patient 05/21/22 19-Jan-2002     (approximate)   History   Chief Complaint Chest Pain   HPI  Sabrina Bell is a 21 y.o. female with past medical history of hypertension, diabetes, migraines, asthma, and anxiety who presents to the ED complaining of chest pain.  Patient reports that she was on the phone talking with the cardiology office about 1 hour prior to arrival when she started having sharp pain in the center of her chest moving under her left breast.  She states it started after she was told the appointment was "urgent" and she started to feel anxious.  She denies any associated fevers, cough, shortness of breath, leg swelling or pain.  She states the pain lasted for about 2 hours before gradually resolving.  She is now asymptomatic.  She reports calling cardiology today to schedule an appointment for follow-up related to syncopal episode she was seen in the ED for recently.  She was able to schedule an appointment 2 days from now.     Physical Exam   Triage Vital Signs: ED Triage Vitals  Enc Vitals Group     BP 05/21/22 1709 136/87     Pulse Rate 05/21/22 1709 90     Resp 05/21/22 1709 16     Temp 05/21/22 1709 98.2 F (36.8 C)     Temp Source 05/21/22 1709 Oral     SpO2 05/21/22 1709 99 %     Weight 05/21/22 1711 (!) 508 lb (230.4 kg)     Height 05/21/22 1711 '5\' 7"'$  (1.702 m)     Head Circumference --      Peak Flow --      Pain Score 05/21/22 1710 5     Pain Loc --      Pain Edu? --      Excl. in [employer]? --     Most recent vital signs: Vitals:   05/21/22 1709 05/21/22 2123  BP: 136/87 (!) 147/78  Pulse: 90 82  Resp: 16 17  Temp: 98.2 F (36.8 C) 98.2 F (36.8 C)  SpO2: 99% 99%    Constitutional: Alert and oriented. Eyes: Conjunctivae are normal. Head: Atraumatic. Nose: No congestion/rhinnorhea. Mouth/Throat: Mucous membranes are moist.  Cardiovascular:  Normal rate, regular rhythm. Grossly normal heart sounds.  2+ radial pulses bilaterally. Respiratory: Normal respiratory effort.  No retractions. Lungs CTAB.  No chest wall tenderness to palpation. Gastrointestinal: Soft and nontender. No distention. Musculoskeletal: No lower extremity tenderness nor edema.  Neurologic:  Normal speech and language. No gross focal neurologic deficits are appreciated.    ED Results / Procedures / Treatments   Labs (all labs ordered are listed, but only abnormal results are displayed) Labs Reviewed  CBC - Abnormal; Notable for the following components:      Result Value   Hemoglobin 9.7 (*)    HCT 33.4 (*)    MCH 24.2 (*)    MCHC 29.0 (*)    RDW 15.9 (*)    All other components within normal limits  BASIC METABOLIC PANEL  POC URINE PREG, ED  TROPONIN I (HIGH SENSITIVITY)     EKG  ED ECG REPORT I, Blake Divine, the attending physician, personally viewed and interpreted this ECG.   Date: 05/21/2022  EKG Time: 17:12  Rate: 85  Rhythm: normal sinus rhythm  Axis: Normal  Intervals:none  ST&T Change: None  RADIOLOGY CXR  reviewed and interpreted by me with no infiltrate, edema, or effusion.  PROCEDURES:  Critical Care performed: No  Procedures   MEDICATIONS ORDERED IN ED: Medications - No data to display   IMPRESSION / MDM / [place] / ED COURSE  I reviewed the triage vital signs and the nursing notes.                              21 y.o. female with past medical history of hypertension, diabetes, migraines, asthma, and anxiety who presents to the ED complaining of episode of chest pain lasting about 2 hours described as sharp and radiating to the area under her left breast.  Patient's presentation is most consistent with acute presentation with potential threat to life or bodily function.  Differential diagnosis includes, but is not limited to, ACS, PE, pneumonia, pneumothorax, dissection, GERD, anxiety.  Patient  well-appearing and in no acute distress, vital signs are unremarkable and she states she is currently asymptomatic.  EKG shows no evidence of arrhythmia or ischemia and troponin within normal limits.  Low suspicion for ACS in this young patient with atypical symptoms.  Given resolution of symptoms, doubt PE or dissection.  Chest x-ray shows no focal infiltrate or edema, does show borderline cardiomegaly, which she may discuss with cardiology at her follow-up appointment.  Remainder of labs are reassuring with no significant anemia, leukocytosis, electrolyte abnormality, or AKI.  She is appropriate for discharge home with cardiology follow-up, was counseled to return to the ED for new or worsening symptoms.  Patient agrees with plan.      FINAL CLINICAL IMPRESSION(S) / ED DIAGNOSES   Final diagnoses:  Atypical chest pain     Rx / DC Orders   ED Discharge Orders     None        Note:  This document was prepared using Dragon voice recognition software and may include unintentional dictation errors.   Blake Divine, MD 05/21/22 2126

## 2022-05-21 NOTE — ED Triage Notes (Signed)
Patient brought in by mother- c/o central chest pain onset about 1.5 hr ago. States she was here last week with similar symptoms. Feeling very anxious.

## 2022-05-23 DIAGNOSIS — R55 Syncope and collapse: Secondary | ICD-10-CM | POA: Insufficient documentation

## 2022-05-23 DIAGNOSIS — E662 Morbid (severe) obesity with alveolar hypoventilation: Secondary | ICD-10-CM | POA: Insufficient documentation

## 2022-05-23 NOTE — Progress Notes (Deleted)
Primary Care Provider: Pcp, No [facility] Cardiologist: None Electrophysiologist: None  Clinic Note: No chief complaint on file.   ===================================  ASSESSMENT/PLAN   Problem List Items Addressed This Visit   None   ===================================  HPI:    Sabrina Bell is a super morbidly obese 21 y.o. female with PMH (by chart review-HTN, OSA (likely OHS/Pickwickian), asthma with history of status asthmaticus), pre-DM who is being seen today for the evaluation of SYNCOPE and CHEST PAIN at the request of Hinda Kehr, MD [facility] EDP).  Sabrina Bell was last seen on ***  Recent Hospitalizations:  05/16/2022-[facility] ER: EMS called for a syncopal episode.  Per report, she was playing videogames sitting on the couch with her family and passed out.  Further information indicated that she had been laughing very hard and may have been holding her breath.  It did not appear to be that there was any ictal or postictal signs. => Upon arrival.  Vitals are stable.  No complaints of chest pain or palpitations prior to the episode. EKG STach 101 bpm.:  Troponin<2, Hgb 9.0 05/21/2022-[facility] ER: Presented with chest pain described as a sharp pain in the center of her chest mostly on the left breast.  This occurred after she was told that her cardiology appointment was urgent and she started to feel anxious.  Pain lasted 2 hours and then gradually resolved. Troponin<2; Hgb 9.7  Reviewed  CV studies:    The following studies were reviewed today: (if available, images/films reviewed: From Epic Chart or Care Everywhere) TTE (pediatric) 02/17/2017: Technically difficult, poor acoustic windows.  Levocardia.  Abdominal situs solitus.  Normal cardiac connections.  Normal ventricle size and functions.  Normal atrial sizes with intact IAS.  Apparent appropriate venous return.  Normal valves.  No evidence to suggest coarctation.  No patent ductus.  No effusion..   Interval  History:   Sabrina Bell   CV Review of Symptoms (Summary) Cardiovascular ROS: {roscv:310661}  REVIEWED OF SYSTEMS   ROS  I have reviewed and (if needed) personally updated the patient's problem list, medications, allergies, past medical and surgical history, social and family history.   PAST MEDICAL HISTORY   Past Medical History:  Diagnosis Date   Asthma    Borderline diabetes    Depression    Diabetes mellitus without complication ([place])    Migraines    Obesity     PAST SURGICAL HISTORY   No past surgical history on file.  Immunization History  Administered Date(s) Administered   DTaP 03/01/2002, 05/07/2002, 07/09/2002   Hepatitis B, adult 01/28/2002, 01/28/2002, 07/09/2002   HiB (PRP-OMP) 03/01/2002, 05/07/2002, 07/09/2002, 12/31/2002   IPV 03/01/2002, 05/07/2002   Influenza Inj Mdck Quad With Preservative 08/15/2018   Influenza,inj,Quad PF,6+ Mos 08/11/2019   MMR 12/31/2002   Pneumococcal Polysaccharide-23 03/01/2002, 05/07/2002, 07/09/2002   Varicella 12/31/2002    MEDICATIONS/ALLERGIES   No outpatient medications have been marked as taking for the 05/24/22 encounter (Appointment) with Leonie Man, MD.    Allergies  Allergen Reactions   Cashew Nut Oil Anaphylaxis   Omnicef [Cefdinir] Hives and Other (See Comments)   Other Other (See Comments)    Allergic to walnuts, pecans and almonds per allergy test   Watermelon Flavor [Flavoring Agent] Other (See Comments)    Felt like her throat was itchy and closing    SOCIAL HISTORY/FAMILY HISTORY   Reviewed in Epic:   Social History   Tobacco Use   Smoking status: Never  Smokeless tobacco: Never  Vaping Use   Vaping Use: Never used  Substance Use Topics   Alcohol use: No   Drug use: No   Social History   Social History Narrative   Not on file   Family History  Problem Relation Age of Onset   Diabetes Mother    Vision loss Mother    Ovarian cancer Paternal Grandmother    Prostate  cancer Paternal Grandfather     OBJCTIVE -PE, EKG, labs   Wt Readings from Last 3 Encounters:  05/21/22 (!) 508 lb (230.4 kg)  05/16/22 (!) 508 lb (230.4 kg)  03/20/22 (!) 425 lb (192.8 kg)    Physical Exam: There were no vitals taken for this visit. Physical Exam   Adult ECG Report  Rate: *** ;  Rhythm: {rhythm:17366};   Narrative Interpretation: ***  Recent Labs:  ***  Lab Results  Component Value Date   CHOL 155 11/05/2018   HDL 54 11/05/2018   LDLCALC 92 11/05/2018   TRIG 47 11/05/2018   CHOLHDL 2.9 11/05/2018   Lab Results  Component Value Date   CREATININE 0.52 05/21/2022   BUN 15 05/21/2022   NA 138 05/21/2022   K 3.9 05/21/2022   CL 104 05/21/2022   CO2 25 05/21/2022      Latest Ref Rng & Units 05/21/2022    5:17 PM 05/16/2022   12:26 AM 03/20/2022   11:38 PM  CBC  WBC 4.0 - 10.5 K/uL 10.2  9.4  8.4   Hemoglobin 12.0 - 15.0 g/dL 9.7  9.0  9.7   Hematocrit 36.0 - 46.0 % 33.4  31.3  32.5   Platelets 150 - 400 K/uL 287  301  298     Lab Results  Component Value Date   HGBA1C 5.8 (H) 09/22/2020   Lab Results  Component Value Date   TSH 2.130 09/22/2020    ================================================== I spent a total of *** minutes with the patient spent in direct patient consultation.  Additional time spent with chart review  / charting (studies, outside notes, etc): *** min Total Time: *** min  Current medicines are reviewed at length with the patient today.  (+/- concerns) ***  Notice: This dictation was prepared with Dragon dictation along with smart phrase technology. Any transcriptional errors that result from this process are unintentional and may not be corrected upon review.   Studies Ordered:  No orders of the defined types were placed in this encounter.  No orders of the defined types were placed in this encounter.   Patient Instructions / Medication Changes & Studies & Tests Ordered   There are no Patient Instructions on file  for this visit.    Leonie Man, MD, MS Glenetta Hew, M.D., M.S. Interventional Cardiologist   Pager # [phone] Phone # [phone] [address] [place], [place] [address]    Thank you for choosing [facility]!!

## 2022-05-24 ENCOUNTER — Ambulatory Visit: Payer: BC Managed Care – PPO | Admitting: Cardiology

## 2022-05-24 DIAGNOSIS — I10 Essential (primary) hypertension: Secondary | ICD-10-CM

## 2022-05-24 DIAGNOSIS — R55 Syncope and collapse: Secondary | ICD-10-CM

## 2022-05-24 DIAGNOSIS — E662 Morbid (severe) obesity with alveolar hypoventilation: Secondary | ICD-10-CM

## 2022-05-24 DIAGNOSIS — G4733 Obstructive sleep apnea (adult) (pediatric): Secondary | ICD-10-CM

## 2022-05-24 DIAGNOSIS — R7303 Prediabetes: Secondary | ICD-10-CM

## 2022-06-06 ENCOUNTER — Telehealth: Payer: Self-pay

## 2022-06-06 NOTE — Telephone Encounter (Addendum)
Fax Refill Request received for medroxyPROGESTERone (PROVERA) 10 MG tablet. Last filled 03/09/2022. Patient last office visit 10/24/20 with Dr. Georgianne Fick for AUB. Next appointment 06/27/22 with Dr. Amalia Hailey.

## 2022-06-07 DIAGNOSIS — R0789 Other chest pain: Secondary | ICD-10-CM | POA: Diagnosis not present

## 2022-06-07 DIAGNOSIS — D649 Anemia, unspecified: Secondary | ICD-10-CM | POA: Insufficient documentation

## 2022-06-07 DIAGNOSIS — R079 Chest pain, unspecified: Secondary | ICD-10-CM | POA: Diagnosis not present

## 2022-06-07 DIAGNOSIS — R0602 Shortness of breath: Secondary | ICD-10-CM | POA: Diagnosis not present

## 2022-06-07 DIAGNOSIS — R42 Dizziness and giddiness: Secondary | ICD-10-CM | POA: Diagnosis not present

## 2022-06-07 DIAGNOSIS — I959 Hypotension, unspecified: Secondary | ICD-10-CM | POA: Diagnosis not present

## 2022-06-07 DIAGNOSIS — R918 Other nonspecific abnormal finding of lung field: Secondary | ICD-10-CM | POA: Diagnosis not present

## 2022-06-07 NOTE — ED Triage Notes (Signed)
Pt states has been having chest pain, shortness of breath for a week. Pt states "I can't barely walk, I can't stand up for long periods of time without feeling dizzy". Pt states has been seen for same here and was told to follow up with cardiology but has not.

## 2022-06-07 NOTE — ED Triage Notes (Signed)
FIRST NURSE NOTE:  Pt arrived via [facility] c/o feeling ill x 1 week, c/o trouble breathing, SpO2 in the 80s, felt dizzy and trouble breathing had to sit on the stairs.  Pt used inhaler which helped, pt has hx of asthma.   VSS P-92 99% RA 133/55  No known sick contacts

## 2022-06-08 ENCOUNTER — Encounter: Payer: Self-pay | Admitting: Radiology

## 2022-06-08 ENCOUNTER — Emergency Department: Payer: BC Managed Care – PPO

## 2022-06-08 ENCOUNTER — Emergency Department
Admission: EM | Admit: 2022-06-08 | Discharge: 2022-06-08 | Disposition: A | Discharge status: Home / Self Care | Payer: BC Managed Care – PPO | Attending: Emergency Medicine | Admitting: Emergency Medicine

## 2022-06-08 ENCOUNTER — Other Ambulatory Visit: Payer: Self-pay

## 2022-06-08 DIAGNOSIS — R079 Chest pain, unspecified: Secondary | ICD-10-CM | POA: Diagnosis not present

## 2022-06-08 DIAGNOSIS — R918 Other nonspecific abnormal finding of lung field: Secondary | ICD-10-CM | POA: Diagnosis not present

## 2022-06-08 DIAGNOSIS — D649 Anemia, unspecified: Secondary | ICD-10-CM

## 2022-06-08 DIAGNOSIS — R531 Weakness: Secondary | ICD-10-CM

## 2022-06-08 DIAGNOSIS — R0602 Shortness of breath: Secondary | ICD-10-CM | POA: Diagnosis not present

## 2022-06-08 LAB — BASIC METABOLIC PANEL
Anion gap: 7 (ref 5–15)
BUN: 13 mg/dL (ref 6–20)
CO2: 23 mmol/L (ref 22–32)
Calcium: 8.9 mg/dL (ref 8.9–10.3)
Chloride: 106 mmol/L (ref 98–111)
Creatinine, Ser: 0.75 mg/dL (ref 0.44–1.00)
GFR, Estimated: >60 mL/min (ref >60)
Glucose, Bld: 101 mg/dL — ABNORMAL HIGH (ref 70–99)
Potassium: 4.1 mmol/L (ref 3.5–5.1)
Sodium: 136 mmol/L (ref 135–145)

## 2022-06-08 LAB — CBC
HCT: 31.7 % — ABNORMAL LOW (ref 36.0–46.0)
Hemoglobin: 9.2 g/dL — ABNORMAL LOW (ref 12.0–15.0)
MCH: 24.3 pg — ABNORMAL LOW (ref 26.0–34.0)
MCHC: 29 g/dL — ABNORMAL LOW (ref 30.0–36.0)
MCV: 83.6 fL (ref 80.0–100.0)
Platelets: 306 10*3/uL (ref 150–400)
RBC: 3.79 MIL/uL — ABNORMAL LOW (ref 3.87–5.11)
RDW: 15.9 % — ABNORMAL HIGH (ref 11.5–15.5)
WBC: 9.6 10*3/uL (ref 4.0–10.5)
nRBC: 0 % (ref 0.0–0.2)

## 2022-06-08 LAB — POC URINE PREG, ED: Preg Test, Ur: NEGATIVE

## 2022-06-08 LAB — TROPONIN I (HIGH SENSITIVITY)
Troponin I (High Sensitivity): <2 ng/L (ref <18)
Troponin I (High Sensitivity): 2 ng/L (ref <18)

## 2022-06-08 MED ORDER — ALBUTEROL SULFATE HFA 108 (90 BASE) MCG/ACT IN AERS: 2.0000 | INHALATION_SPRAY | RESPIRATORY_TRACT | 0 refills | Status: AC | PRN

## 2022-06-08 MED ORDER — METHYLPREDNISOLONE SODIUM SUCC 125 MG IJ SOLR
125.0000 mg | Freq: Once | INTRAMUSCULAR | Status: AC
  Administered 2022-06-08: 125 mg via INTRAVENOUS
  Filled 2022-06-08: qty 2

## 2022-06-08 MED ORDER — PREDNISONE 20 MG PO TABS: 60.0000 mg | ORAL_TABLET | Freq: Every day | ORAL | 0 refills | Status: AC

## 2022-06-08 MED ORDER — IOHEXOL 350 MG/ML SOLN
100.0000 mL | Freq: Once | INTRAVENOUS | Status: AC | PRN
  Administered 2022-06-08: 100 mL via INTRAVENOUS

## 2022-06-08 MED ORDER — SODIUM CHLORIDE 0.9 % IV BOLUS
500.0000 mL | Freq: Once | INTRAVENOUS | Status: AC
  Administered 2022-06-08: 500 mL via INTRAVENOUS

## 2022-06-08 MED ORDER — FERROUS SULFATE 325 (65 FE) MG PO TBEC: 325.0000 mg | DELAYED_RELEASE_TABLET | Freq: Every day | ORAL | 0 refills | Status: DC

## 2022-06-08 MED ORDER — IPRATROPIUM-ALBUTEROL 0.5-2.5 (3) MG/3ML IN SOLN
3.0000 mL | Freq: Once | RESPIRATORY_TRACT | Status: AC
  Administered 2022-06-08: 3 mL via RESPIRATORY_TRACT
  Filled 2022-06-08: qty 3

## 2022-06-08 NOTE — ED Notes (Signed)
Yeni in lab notified of need for repeat assist with 0300 troponin draw. Mahala Menghini will send phlebotomy.

## 2022-06-08 NOTE — ED Notes (Signed)
This RN attempted PIV X1 for CTA, MD aware, IV team consult placed.

## 2022-06-08 NOTE — ED Notes (Signed)
Pt ambulatory with semi steady gait to restroom, pt pulse ox maintained above 92%, pt 98% at rest and recovered quickly. Some dyspnea on exertion noted.

## 2022-06-08 NOTE — ED Notes (Signed)
CT called to see where pt was in line for CT, pregnancy results and IV placement given, CT states they are coming soon.

## 2022-06-08 NOTE — ED Notes (Signed)
Pt requests IVF to finish before she leaves, MD aware and sates this is fine. Pt's status changed to "pending discharge'. Pt educated to start looking for a ride at this time due to pt coming via EMS

## 2022-06-08 NOTE — ED Notes (Signed)
Pt at CT

## 2022-06-08 NOTE — ED Notes (Signed)
Pt ambulatory with steady gait to bedside restroom. NAD noted.

## 2022-06-08 NOTE — ED Notes (Signed)
Pt presents to ED with c/o of CP (pt deneis radiation of CP) and intermittent dizziness for the past week. Pt is not able to explain her dizziness but does deny any nausea accompanying her dizziness. Pt also does endorse dizziness is not related to positional changes. Pt denies cardiac HX but has a f./up for them this Tuesday that was referred by [facility] recently for same.   Mom states she called EMS due to heart rate "being in the 30's for 5 minutes", mom endorses she used a finger pulse ox. Pt and mom then state HR was between 70-80's with no changes. Pt appears to be in NAD.

## 2022-06-08 NOTE — ED Notes (Signed)
Pt sitting in lobby with mother in no acute distress.

## 2022-06-08 NOTE — ED Notes (Signed)
D/C, new RX, and f/up discussed with pt and mom, both verbalized understanding. NAD noted. VSS.

## 2022-06-08 NOTE — ED Provider Notes (Signed)
[facility] Provider Note    Event Date/Time   First MD Initiated Contact with Patient 06/08/22 [phone]     (approximate)   History   Chest Pain   HPI  Sabrina Bell is a 21 y.o. female  here with generalized weakness/lightheadedness. History provided by pt and mother. Per report, pt has had increasing general fatigue, dizziness, lightheadedness x 1-2 weeks. She states that when standing, she feels lightheaded, weak sooner than usual and has had some SOB with getting around. She has had some mild dull associated aching chest pressure. No leg swelling. No h/o PE. No focal numbness or weakness. Per mother, pt sounded "out of it" earlier tonight when she felt weak/lightheaded. Mother reports HR was in the 30s on a home Pulse Ox, though when pt questioned she states it was "dropping" to the 60s then averaging 80s. No actual syncope.        Physical Exam   Triage Vital Signs: ED Triage Vitals [06/08/22 0001]  Enc Vitals Group     BP 130/73     Pulse Rate 86     Resp 20     Temp 98.2 F (36.8 C)     Temp Source Oral     SpO2 97 %     Weight (!) 440 lb 14.7 oz (200 kg)     Height '5\' 7"'$  (1.702 m)     Head Circumference      Peak Flow      Pain Score 3     Pain Loc      Pain Edu?      Excl. in [place]?     Most recent vital signs: Vitals:   06/08/22 1000 06/08/22 1200  BP: 103/60 110/70  Pulse: 76 84  Resp: 20 14  Temp:    SpO2: 99% 100%     General: Awake, no distress.  CV:  Good peripheral perfusion. RRR. No murmurs. Resp:  Normal effort. Lungs CTAB. Abd:  No distention. No tenderness, limited by habitus. Other:  No LE edema. CNII -XII intact. Strength 5/5. No focal deficits noted.   ED Results / Procedures / Treatments   Labs (all labs ordered are listed, but only abnormal results are displayed) Labs Reviewed  BASIC METABOLIC PANEL - Abnormal; Notable for the following components:      Result Value   Glucose, Bld 101 (*)    All other  components within normal limits  CBC - Abnormal; Notable for the following components:   RBC 3.79 (*)    Hemoglobin 9.2 (*)    HCT 31.7 (*)    MCH 24.3 (*)    MCHC 29.0 (*)    RDW 15.9 (*)    All other components within normal limits  POC URINE PREG, ED  TROPONIN I (HIGH SENSITIVITY)  TROPONIN I (HIGH SENSITIVITY)     EKG Normal sinus rhythm, VR 90. PR 150, QRS 86, QTc 435. No acute ST elevations or depressions.   RADIOLOGY CXR: Clear   I also independently reviewed and agree with radiologist interpretations.   PROCEDURES:  Critical Care performed: No  .1-3 Lead EKG Interpretation  Performed by: Duffy Bruce, MD Authorized by: Duffy Bruce, MD     Interpretation: normal     ECG rate:  70-90   ECG rate assessment: normal     Rhythm: sinus rhythm     Ectopy: none     Conduction: normal   Comments:     Indication: Chest pain/SOB  MEDICATIONS ORDERED IN ED: Medications  ipratropium-albuterol (DUONEB) 0.5-2.5 (3) MG/3ML nebulizer solution 3 mL (3 mLs Nebulization Given 06/08/22 0808)  sodium chloride 0.9 % bolus 500 mL (0 mLs Intravenous Stopped 06/08/22 1053)  iohexol (OMNIPAQUE) 350 MG/ML injection 100 mL (100 mLs Intravenous Contrast Given 06/08/22 1016)  ipratropium-albuterol (DUONEB) 0.5-2.5 (3) MG/3ML nebulizer solution 3 mL (3 mLs Nebulization Given 06/08/22 1159)  methylPREDNISolone sodium succinate (SOLU-MEDROL) 125 mg/2 mL injection 125 mg (125 mg Intravenous Given 06/08/22 1159)     IMPRESSION / MDM / ASSESSMENT AND PLAN / ED COURSE  I reviewed the triage vital signs and the nursing notes.                               The patient is on the cardiac monitor to evaluate for evidence of arrhythmia and/or significant heart rate changes.   Ddx:  Differential includes the following, with pertinent life- or limb-threatening emergencies considered:  Deconditioning, asthma, ACS, PE, angina, anemia, orthostasis, vasovagal symptoms,  arrhythmia  Patient's presentation is most consistent with acute presentation with potential threat to life or bodily function.  MDM:  21 yo F here with lightheadedness, SOB. Suspect multifactorial dyspnea/deconditioning in setting of anemia, habitus, possible pulm HTN. CT angio obtained given her recurrent episodes of CP/SOB, shows no PE or other lung abnormality. EKG is nonischemic and trops negative x 2. She does appear to be anemic - will start on iron supplementation. No arrhythmia noted on tele and EKG is largely reassuring. She has cardiology f/u soon which is reasonable. No other apparent emergent pathology at this time. No evidence of malignant arrhythmia. Sats remain >92% on RA with exertion.   No arrhythmia noted on tele in ED as well. No other high risk features of near syncope.   Pt also noted to intermittently have what may be some episodes of sleep apnea. She reportedly was supposed to be on CPAP but no longer has her machine. Will refer to outpt pulm/sleep.  MEDICATIONS GIVEN IN ED: Medications  ipratropium-albuterol (DUONEB) 0.5-2.5 (3) MG/3ML nebulizer solution 3 mL (3 mLs Nebulization Given 06/08/22 0808)  sodium chloride 0.9 % bolus 500 mL (0 mLs Intravenous Stopped 06/08/22 1053)  iohexol (OMNIPAQUE) 350 MG/ML injection 100 mL (100 mLs Intravenous Contrast Given 06/08/22 1016)  ipratropium-albuterol (DUONEB) 0.5-2.5 (3) MG/3ML nebulizer solution 3 mL (3 mLs Nebulization Given 06/08/22 1159)  methylPREDNISolone sodium succinate (SOLU-MEDROL) 125 mg/2 mL injection 125 mg (125 mg Intravenous Given 06/08/22 1159)     Consults:  None   EMR reviewed  Reviewed prior ED visits, visit for CP and syncope in last 2 months     FINAL CLINICAL IMPRESSION(S) / ED DIAGNOSES   Final diagnoses:  Generalized weakness  Anemia, unspecified type     Rx / DC Orders   ED Discharge Orders          Ordered    predniSONE (DELTASONE) 20 MG tablet  Daily        06/08/22 1206     albuterol (VENTOLIN HFA) 108 (90 Base) MCG/ACT inhaler  Every 4 hours PRN        06/08/22 1206    ferrous sulfate 325 (65 FE) MG EC tablet  Daily with breakfast        06/08/22 1206             Note:  This document was prepared using Dragon voice recognition software and may include unintentional dictation errors.  Duffy Bruce, MD 06/08/22 1226

## 2022-06-08 NOTE — ED Notes (Signed)
IV team at bedside 

## 2022-06-11 ENCOUNTER — Ambulatory Visit (INDEPENDENT_AMBULATORY_CARE_PROVIDER_SITE_OTHER): Payer: BC Managed Care – PPO

## 2022-06-11 ENCOUNTER — Encounter: Payer: Self-pay | Admitting: Cardiology

## 2022-06-11 ENCOUNTER — Ambulatory Visit (INDEPENDENT_AMBULATORY_CARE_PROVIDER_SITE_OTHER): Payer: BC Managed Care – PPO | Admitting: Cardiology

## 2022-06-11 VITALS — BP 136/77 | HR 66 | Ht 67.0 in | Wt >= 6400 oz

## 2022-06-11 DIAGNOSIS — R42 Dizziness and giddiness: Secondary | ICD-10-CM

## 2022-06-11 NOTE — Patient Instructions (Signed)
Medication Instructions:   Your physician recommends that you continue on your current medications as directed. Please refer to the Current Medication list given to you today.   *If you need a refill on your cardiac medications before your next appointment, please call your pharmacy*   Testing/Procedures:  Your physician has recommended that you wear a Zio XT monitor for 2 weeks. This will be mailed to your home address in 4-5 business days.   Your clinician has requested a Zio heart rhythm monitor by [employer] to be mailed to your home for you to wear for 14 days. You should expect a small box to arrive via USPS (or FedEx in some cases) within this next week. If you do not receive it please call [employer] at [phone].  Closely watching your heart at this time will help your care team understand more and provide information needed to develop your plan of care.  Please apply your Zio patch monitor the day you receive it. Keep this packaging, you will use this to return your Zio monitor.  You will easily be able to apply the monitor with the instructions provided in the Patient Guide.  If you need assistance, [employer] representatives are available 24/7 at [phone].  You can also download the [facility] app on your phone to view detailed application instructions and log symptoms.  After you wear your monitor for 14 days, place it back in the blue box or envelope, along with your Symptom Log.  To send your monitor back: Simply use the pre-addressed and pre-paid box/envelope.  Send it back through [employer] the same day you remove it via your local post office or by placing it in your mailbox.  As soon as we receive the results, they will be reviewed and your clinician will contact you.  For the first 24 hours- it is essential to not shower or exercise, to allow the patch to adhere to your skin. Avoid excessive sweating to help maximize wear time. Do not submerge the device, no hot  tubs, and no swimming pools. Keep any lotions or oils away from the patch. After 24 hours you may shower with the patch on. Take brief showers with your back facing the shower head.  Do not remove patch once it has been placed because that will interrupt data and decrease adhesive wear time. Push the button when you have any symptoms and write down what you were feeling. Once you have completed wearing your monitor, remove and place into box which has postage paid and place in your outgoing mailbox.  If for some reason you have misplaced your box then call our office and we can provide another box and/or mail it off for you.    Follow-Up: At [facility], you and your health needs are our priority.  As part of our continuing mission to provide you with exceptional heart care, we have created designated Provider Care Teams.  These Care Teams include your primary Cardiologist (physician) and Advanced Practice Providers (APPs -  Physician Assistants and Nurse Practitioners) who all work together to provide you with the care you need, when you need it.  We recommend signing up for the patient portal called "MyChart".  Sign up information is provided on this After Visit Summary.  MyChart is used to connect with patients for Virtual Visits (Telemedicine).  Patients are able to view lab/test results, encounter notes, upcoming appointments, etc.  Non-urgent messages can be sent to your provider as well.   To learn  more about what you can do with MyChart, go to [email or web address].    Your next appointment:   6 week(s)  The format for your next appointment:   In Person  Provider:   You may see Kate Sable, MD or one of the following Advanced Practice Providers on your designated Care Team:   Murray Hodgkins, NP Christell Faith, PA-C Cadence Kathlen Mody, [place]    Other Instructions   Important Information About Sugar

## 2022-06-11 NOTE — Progress Notes (Signed)
Cardiology Office Note:    Date:  06/11/2022   ID:  Sabrina Bell, DOB 04/08/2001, MRN [id number]  PCP:  Kathyrn Lass   [place] Providers Cardiologist:  None     Referring MD: Hinda Kehr, MD   Chief Complaint  Patient presents with   Other    Syncope. Meds reviewed verbally with pt.   Sabrina Bell is a 21 y.o. female who is being seen today for the evaluation of dizziness at the request of Hinda Kehr, MD.   History of Present Illness:    Sabrina Bell is a 21 y.o. female with a hx of asthma, obesity who presents due to dizziness.  Patient states feeling dizzy for about a month now.  Symptoms occur randomly, not associated with exertion or positioning.  Symptoms can last anywhere from 5 minutes to 30 minutes.  Denies any history of heart disease.  Denies palpitations, denies passing out.  She noticed her heart rates in the 60s to 70s at home, prompting her to go to the emergency room per EMS.  Evaluated in the emergency room 3 days ago where EKG, troponins, was unrevealing.   Pediatric echo 2018 grossly normal, poor acoustic windows.  Past Medical History:  Diagnosis Date   Asthma    Borderline diabetes    Depression    Diabetes mellitus without complication ([place])    Migraines    Obesity     History reviewed. No pertinent surgical history.  Current Medications: Current Meds  Medication Sig   albuterol (VENTOLIN HFA) 108 (90 Base) MCG/ACT inhaler Inhale 2 puffs into the lungs every 4 (four) hours as needed for wheezing or shortness of breath.   EPINEPHrine 0.3 mg/0.3 mL IJ SOAJ injection Inject 0.3 mLs (0.3 mg total) into the muscle as needed for anaphylaxis.   ferrous sulfate 325 (65 FE) MG EC tablet Take 1 tablet (325 mg total) by mouth daily with breakfast.   medroxyPROGESTERone (PROVERA) 10 MG tablet Take 1 tablet (10 mg total) by mouth daily.   predniSONE (DELTASONE) 20 MG tablet Take 3 tablets (60 mg total) by mouth daily for 5 days.    Spacer/Aero-Holding Chambers (AEROCHAMBER PLUS) inhaler Use as instructed     Allergies:   Cashew nut oil, Omnicef [cefdinir], Other, and Watermelon flavor [flavoring agent]   Social History   Socioeconomic History   Marital status: Single    Spouse name: Not on file   Number of children: Not on file   Years of education: Not on file   Highest education level: Not on file  Occupational History   Not on file  Tobacco Use   Smoking status: Never   Smokeless tobacco: Never  Vaping Use   Vaping Use: Never used  Substance and Sexual Activity   Alcohol use: No   Drug use: No   Sexual activity: Never  Other Topics Concern   Not on file  Social History Narrative   Not on file   Social Determinants of Health   Financial Resource Strain: Not on file  Food Insecurity: Not on file  Transportation Needs: Not on file  Physical Activity: Not on file  Stress: Not on file  Social Connections: Not on file     Family History: The patient's family history includes Diabetes in her mother; Ovarian cancer in her paternal grandmother; Prostate cancer in her paternal grandfather; Vision loss in her mother.  ROS:   Please see the history of present illness.  All other systems reviewed and are negative.  EKGs/Labs/Other Studies Reviewed:    The following studies were reviewed today:   EKG:  EKG is  ordered today.  The ekg ordered today demonstrates normal sinus rhythm, normal ECG  Recent Labs: 03/20/2022: ALT 53 06/08/2022: BUN 13; Creatinine, Ser 0.75; Hemoglobin 9.2; Platelets 306; Potassium 4.1; Sodium 136  Recent Lipid Panel    Component Value Date/Time   CHOL 155 11/05/2018 1112   TRIG 47 11/05/2018 1112   HDL 54 11/05/2018 1112   CHOLHDL 2.9 11/05/2018 1112   VLDL 9 11/05/2018 1112   LDLCALC 92 11/05/2018 1112     Risk Assessment/Calculations:              Physical Exam:    VS:  BP 136/77 (BP Location: Right Wrist, Patient Position: Sitting, Cuff Size: Large)    Pulse 66   Ht '5\' 7"'$  (1.702 m)   Wt (!) 499 lb 2 oz (226.4 kg)   LMP  (LMP Unknown) Comment: neg preg test today  SpO2 98%   BMI 78.17 kg/m     Wt Readings from Last 3 Encounters:  06/11/22 (!) 499 lb 2 oz (226.4 kg)  06/08/22 (!) 440 lb 14.7 oz (200 kg)  05/21/22 (!) 508 lb (230.4 kg)     GEN:  Well nourished, well developed in no acute distress HEENT: Normal NECK: No JVD; No carotid bruits LYMPHATICS: No lymphadenopathy CARDIAC: RRR, no murmurs, rubs, gallops RESPIRATORY:  Clear to auscultation without rales, wheezing or rhonchi  ABDOMEN: Soft, non-tender, non-distended MUSCULOSKELETAL:  No edema; No deformity  SKIN: Warm and dry NEUROLOGIC:  Alert and oriented x 3 PSYCHIATRIC:  Normal affect   ASSESSMENT:    1. Dizziness   2. Morbid obesity ([place])    PLAN:    In order of problems listed above:  Dizziness, no palpitations, EKG was normal, place cardiac monitor to rule out any significant arrhythmias, previous echo with no structural abnormalities.  Recommend follow-up with PCP regarding other etiologies such as vertigo, inner ear.  Orthostatic vitals were normal. Morbid obesity, low-calorie diet, weight loss advised.  Consider Ozempic/Wegovy.  Follow-up after cardiac monitor.      Medication Adjustments/Labs and Tests Ordered: Current medicines are reviewed at length with the patient today.  Concerns regarding medicines are outlined above.  Orders Placed This Encounter  Procedures   LONG TERM MONITOR (3-14 DAYS)   No orders of the defined types were placed in this encounter.   Patient Instructions  Medication Instructions:   Your physician recommends that you continue on your current medications as directed. Please refer to the Current Medication list given to you today.   *If you need a refill on your cardiac medications before your next appointment, please call your pharmacy*   Testing/Procedures:  Your physician has recommended that you wear a Zio XT  monitor for 2 weeks. This will be mailed to your home address in 4-5 business days.   Your clinician has requested a Zio heart rhythm monitor by [employer] to be mailed to your home for you to wear for 14 days. You should expect a small box to arrive via USPS (or FedEx in some cases) within this next week. If you do not receive it please call [employer] at [phone].  Closely watching your heart at this time will help your care team understand more and provide information needed to develop your plan of care.  Please apply your Zio patch monitor the day you receive it. Keep  this packaging, you will use this to return your Zio monitor.  You will easily be able to apply the monitor with the instructions provided in the Patient Guide.  If you need assistance, [employer] representatives are available 24/7 at [phone].  You can also download the [facility] app on your phone to view detailed application instructions and log symptoms.  After you wear your monitor for 14 days, place it back in the blue box or envelope, along with your Symptom Log.  To send your monitor back: Simply use the pre-addressed and pre-paid box/envelope.  Send it back through [employer] the same day you remove it via your local post office or by placing it in your mailbox.  As soon as we receive the results, they will be reviewed and your clinician will contact you.  For the first 24 hours- it is essential to not shower or exercise, to allow the patch to adhere to your skin. Avoid excessive sweating to help maximize wear time. Do not submerge the device, no hot tubs, and no swimming pools. Keep any lotions or oils away from the patch. After 24 hours you may shower with the patch on. Take brief showers with your back facing the shower head.  Do not remove patch once it has been placed because that will interrupt data and decrease adhesive wear time. Push the button when you have any symptoms and write down what you were  feeling. Once you have completed wearing your monitor, remove and place into box which has postage paid and place in your outgoing mailbox.  If for some reason you have misplaced your box then call our office and we can provide another box and/or mail it off for you.    Follow-Up: At [facility], you and your health needs are our priority.  As part of our continuing mission to provide you with exceptional heart care, we have created designated Provider Care Teams.  These Care Teams include your primary Cardiologist (physician) and Advanced Practice Providers (APPs -  Physician Assistants and Nurse Practitioners) who all work together to provide you with the care you need, when you need it.  We recommend signing up for the patient portal called "MyChart".  Sign up information is provided on this After Visit Summary.  MyChart is used to connect with patients for Virtual Visits (Telemedicine).  Patients are able to view lab/test results, encounter notes, upcoming appointments, etc.  Non-urgent messages can be sent to your provider as well.   To learn more about what you can do with MyChart, go to [email or web address].    Your next appointment:   6 week(s)  The format for your next appointment:   In Person  Provider:   You may see Kate Sable, MD or one of the following Advanced Practice Providers on your designated Care Team:   Murray Hodgkins, NP Christell Faith, PA-C Cadence Kathlen Mody, [place]    Other Instructions   Important Information About Sugar         Signed, Kate Sable, MD  06/11/2022 9:09 AM    [place]

## 2022-06-13 DIAGNOSIS — R42 Dizziness and giddiness: Secondary | ICD-10-CM

## 2022-06-13 NOTE — Addendum Note (Signed)
Addended by: Britt Bottom on: 06/13/2022 10:26 AM   Modules accepted: Orders

## 2022-06-27 ENCOUNTER — Ambulatory Visit (INDEPENDENT_AMBULATORY_CARE_PROVIDER_SITE_OTHER): Payer: BC Managed Care – PPO | Admitting: Obstetrics and Gynecology

## 2022-06-27 ENCOUNTER — Encounter: Payer: Self-pay | Admitting: Obstetrics and Gynecology

## 2022-06-27 VITALS — BP 127/74 | Ht 67.0 in | Wt >= 6400 oz

## 2022-06-27 DIAGNOSIS — D369 Benign neoplasm, unspecified site: Secondary | ICD-10-CM

## 2022-06-27 DIAGNOSIS — N83209 Unspecified ovarian cyst, unspecified side: Secondary | ICD-10-CM

## 2022-06-27 NOTE — Progress Notes (Unsigned)
HPI:      [name] is a 21 y.o. G0P0000 who LMP was No LMP recorded (lmp unknown).  Subjective:   She presents today she is completely asymptomatic.CT renal study revealed the possibility of a small complex appearing in the ovary.  The question was if this could represent a small dermoid. Patient is taking daily progesterone as previously prescribed and not having menses.    Hx: The following portions of the patient's history were reviewed and updated as appropriate:             She  has a past medical history of Asthma, Borderline diabetes, Depression, Diabetes mellitus without complication ([place]), Migraines, and Obesity. She does not have any pertinent problems on file. She  has no past surgical history on file. Her family history includes Diabetes in her mother; Ovarian cancer in her paternal grandmother; Prostate cancer in her paternal grandfather; Vision loss in her mother. She  reports that she has never smoked. She has never used smokeless tobacco. She reports that she does not drink alcohol and does not use drugs. She has a current medication list which includes the following prescription(s): albuterol, epinephrine, ferrous sulfate, aerochamber plus, medroxyprogesterone, and [DISCONTINUED] fluticasone. She is allergic to cashew nut oil, omnicef [cefdinir], other, and watermelon flavor [flavoring agent].       Review of Systems:  Review of Systems  Constitutional: Denied constitutional symptoms, night sweats, recent illness, fatigue, fever, insomnia and weight loss.  Eyes: Denied eye symptoms, eye pain, photophobia, vision change and visual disturbance.  Ears/Nose/Throat/Neck: Denied ear, nose, throat or neck symptoms, hearing loss, nasal discharge, sinus congestion and sore throat.  Cardiovascular: Denied cardiovascular symptoms, arrhythmia, chest pain/pressure, edema, exercise intolerance, orthopnea and palpitations.  Respiratory: Denied pulmonary symptoms, asthma,  pleuritic pain, productive sputum, cough, dyspnea and wheezing.  Gastrointestinal: Denied, gastro-esophageal reflux, melena, nausea and vomiting.  Genitourinary:*** Denied genitourinary symptoms including symptomatic vaginal discharge, pelvic relaxation issues, and urinary complaints.  Musculoskeletal: Denied musculoskeletal symptoms, stiffness, swelling, muscle weakness and myalgia.  Dermatologic: Denied dermatology symptoms, rash and scar.  Neurologic: Denied neurology symptoms, dizziness, headache, neck pain and syncope.  Psychiatric: Denied psychiatric symptoms, anxiety and depression.  Endocrine: Denied endocrine symptoms including hot flashes and night sweats.   Meds:   Current Outpatient Medications on File Prior to Visit  Medication Sig Dispense Refill   albuterol (VENTOLIN HFA) 108 (90 Base) MCG/ACT inhaler Inhale 2 puffs into the lungs every 4 (four) hours as needed for wheezing or shortness of breath. 8 g 0   EPINEPHrine 0.3 mg/0.3 mL IJ SOAJ injection Inject 0.3 mLs (0.3 mg total) into the muscle as needed for anaphylaxis. 1 each 1   ferrous sulfate 325 (65 FE) MG EC tablet Take 1 tablet (325 mg total) by mouth daily with breakfast. 30 tablet 0   Spacer/Aero-Holding Chambers (AEROCHAMBER PLUS) inhaler Use as instructed 1 each 2   medroxyPROGESTERone (PROVERA) 10 MG tablet Take 1 tablet (10 mg total) by mouth daily. 30 tablet 11   [DISCONTINUED] fluticasone (FLOVENT HFA) 110 MCG/ACT inhaler Inhale 2 puffs into the lungs 2 (two) times daily. 1 Inhaler 12   No current facility-administered medications on file prior to visit.      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   Pickwickian syndrome ([place]) 05/23/2022   Iron  deficiency 05/17/2020   Chronic iron deficiency anemia 02/03/2020   Prediabetes 02/03/2020   Asthma exacerbation 02/03/2020   Acute exacerbation of severe persistent extrinsic asthma 01/28/2020   Asthma-with history of status asthmaticus 08/13/2019   Abnormal abdominal ultrasound 08/13/2019   Menorrhagia 08/10/2019   Abdominal pain 08/10/2019   Status asthmaticus 11/05/2018   Amenorrhea 11/05/2018   Social isolation 11/05/2018   Educational circumstances 11/05/2018   OSA (obstructive sleep apnea) 05/27/2017   Hypersomnia    Morbid obesity with BMI of 70 and over, adult [facility])    Essential hypertension    Acanthosis nigricans, acquired    Dyspepsia    Anemia    Adjustment reaction with anxiety and depression    Acute respiratory failure with hypoxia (H[facility])    Insomnia 11/29/2015   Generalized anxiety disorder 11/29/2015   MDD (major depressive disorder), recurrent episode, severe ([place]) 11/28/2015     No diagnosis found.  ***   Plan:            1.  *** Orders No orders of the defined types were placed in this encounter.   No orders of the defined types were placed in this encounter.     F/U  No follow-ups on file.  Finis Bud, M.D. 06/27/2022 3:17 PM

## 2022-06-30 ENCOUNTER — Encounter: Payer: Self-pay | Admitting: Obstetrics and Gynecology

## 2022-07-01 NOTE — Telephone Encounter (Signed)
Refill denied. Patient has not been on meds consistently. Assessed at 06/27/22 visit with Dr. Amalia Hailey.

## 2022-07-02 ENCOUNTER — Telehealth: Payer: Self-pay

## 2022-07-02 NOTE — Telephone Encounter (Signed)
Request from [facility] recv'd for Medroxyprogesterone '10mg'$  tab #30 with 11 refills; Last filled 03/09/22.  [facility] # [phone]

## 2022-07-03 ENCOUNTER — Other Ambulatory Visit: Payer: Self-pay

## 2022-07-09 ENCOUNTER — Other Ambulatory Visit: Payer: Self-pay | Admitting: Obstetrics and Gynecology

## 2022-07-09 DIAGNOSIS — N939 Abnormal uterine and vaginal bleeding, unspecified: Secondary | ICD-10-CM

## 2022-07-09 MED ORDER — MEDROXYPROGESTERONE ACETATE 10 MG PO TABS: 10.0000 mg | ORAL_TABLET | Freq: Every day | ORAL | 3 refills | Status: DC

## 2022-07-12 DIAGNOSIS — R42 Dizziness and giddiness: Secondary | ICD-10-CM | POA: Diagnosis not present

## 2022-07-14 ENCOUNTER — Emergency Department
Admission: EM | Admit: 2022-07-14 | Discharge: 2022-07-14 | Disposition: A | Discharge status: Home / Self Care | Payer: BC Managed Care – PPO | Attending: Emergency Medicine | Admitting: Emergency Medicine

## 2022-07-14 ENCOUNTER — Emergency Department: Payer: BC Managed Care – PPO

## 2022-07-14 DIAGNOSIS — R0689 Other abnormalities of breathing: Secondary | ICD-10-CM | POA: Diagnosis not present

## 2022-07-14 DIAGNOSIS — R0602 Shortness of breath: Secondary | ICD-10-CM | POA: Diagnosis present

## 2022-07-14 DIAGNOSIS — J4521 Mild intermittent asthma with (acute) exacerbation: Secondary | ICD-10-CM | POA: Insufficient documentation

## 2022-07-14 DIAGNOSIS — R062 Wheezing: Secondary | ICD-10-CM | POA: Diagnosis not present

## 2022-07-14 DIAGNOSIS — R069 Unspecified abnormalities of breathing: Secondary | ICD-10-CM | POA: Diagnosis not present

## 2022-07-14 LAB — BASIC METABOLIC PANEL
Anion gap: 7 (ref 5–15)
BUN: 15 mg/dL (ref 6–20)
CO2: 25 mmol/L (ref 22–32)
Calcium: 9.1 mg/dL (ref 8.9–10.3)
Chloride: 107 mmol/L (ref 98–111)
Creatinine, Ser: 0.86 mg/dL (ref 0.44–1.00)
GFR, Estimated: >60 mL/min (ref >60)
Glucose, Bld: 104 mg/dL — ABNORMAL HIGH (ref 70–99)
Potassium: 3.7 mmol/L (ref 3.5–5.1)
Sodium: 139 mmol/L (ref 135–145)

## 2022-07-14 LAB — CBC WITH DIFFERENTIAL/PLATELET
Abs Immature Granulocytes: 0.04 10*3/uL (ref 0.00–0.07)
Basophils Absolute: 0 10*3/uL (ref 0.0–0.1)
Basophils Relative: 0 %
Eosinophils Absolute: 0.1 10*3/uL (ref 0.0–0.5)
Eosinophils Relative: 1 %
HCT: 30.6 % — ABNORMAL LOW (ref 36.0–46.0)
Hemoglobin: 8.9 g/dL — ABNORMAL LOW (ref 12.0–15.0)
Immature Granulocytes: 0 %
Lymphocytes Relative: 24 %
Lymphs Abs: 2.2 10*3/uL (ref 0.7–4.0)
MCH: 24.2 pg — ABNORMAL LOW (ref 26.0–34.0)
MCHC: 29.1 g/dL — ABNORMAL LOW (ref 30.0–36.0)
MCV: 83.2 fL (ref 80.0–100.0)
Monocytes Absolute: 0.5 10*3/uL (ref 0.1–1.0)
Monocytes Relative: 5 %
Neutro Abs: 6.3 10*3/uL (ref 1.7–7.7)
Neutrophils Relative %: 70 %
Platelets: 285 10*3/uL (ref 150–400)
RBC: 3.68 MIL/uL — ABNORMAL LOW (ref 3.87–5.11)
RDW: 16 % — ABNORMAL HIGH (ref 11.5–15.5)
WBC: 9.1 10*3/uL (ref 4.0–10.5)
nRBC: 0 % (ref 0.0–0.2)

## 2022-07-14 LAB — MAGNESIUM: Magnesium: 1.8 mg/dL (ref 1.7–2.4)

## 2022-07-14 LAB — BRAIN NATRIURETIC PEPTIDE: B Natriuretic Peptide: 19 pg/mL (ref 0.0–100.0)

## 2022-07-14 MED ORDER — IPRATROPIUM-ALBUTEROL 0.5-2.5 (3) MG/3ML IN SOLN
3.0000 mL | Freq: Once | RESPIRATORY_TRACT | Status: AC
  Administered 2022-07-14: 3 mL via RESPIRATORY_TRACT
  Filled 2022-07-14: qty 3

## 2022-07-14 MED ORDER — PREDNISONE 20 MG PO TABS: 60.0000 mg | ORAL_TABLET | Freq: Every day | ORAL | 0 refills | Status: AC

## 2022-07-14 MED ORDER — METHYLPREDNISOLONE SODIUM SUCC 125 MG IJ SOLR
125.0000 mg | Freq: Once | INTRAMUSCULAR | Status: AC
  Administered 2022-07-14: 125 mg via INTRAVENOUS
  Filled 2022-07-14: qty 2

## 2022-07-14 NOTE — Discharge Instructions (Signed)
We believe that your symptoms are caused today by an exacerbation of your asthma.  Please take the prescribed medications and any medications that you have at home.  Follow up with your doctor as recommended.  If you develop any new or worsening symptoms, including but not limited to fever, persistent vomiting, worsening shortness of breath, or other symptoms that concern you, please return to the Emergency Department immediately.

## 2022-07-14 NOTE — ED Notes (Signed)
Pt verbalizes understanding discharge instructions.  Pt assisted to lobby via [place].

## 2022-07-14 NOTE — ED Triage Notes (Signed)
Pt reports increased SOB and wheezing starting 2230. Pt self administered 2 puffs albuterol inhaler, and 1 albuterol nebulizer.  EMS administered 1 albuterol nebulizer. No improvement after Tx.

## 2022-07-14 NOTE — ED Provider Notes (Signed)
[facility] Provider Note    Event Date/Time   First MD Initiated Contact with Patient 07/14/22 0031     (approximate)   History   Wheezing   HPI  Sabrina Bell is a 21 y.o. female well-known to the emergency department with frequent visits, typically for asthma related complications.  She presents tonight for evaluation of shortness of breath.  She reports that her symptoms have been worse over the course of the day with some wheezing.  She says she has all her medications at home and has been using breathing treatments but they do not seem to help.  She is in no apparent distress upon arrival.  No recent fevers, no chest pain, no abdominal pain, no nausea nor vomiting.     Physical Exam   Triage Vital Signs: ED Triage Vitals [07/14/22 0029]  Enc Vitals Group     BP (!) 131/59     Pulse Rate 88     Resp (!) 26     Temp 98.7 F (37.1 C)     Temp Source Oral     SpO2 100 %     Weight      Height      Head Circumference      Peak Flow      Pain Score      Pain Loc      Pain Edu?      Excl. in [place]?     Most recent vital signs: Vitals:   07/14/22 0200 07/14/22 0230  BP: (!) 117/57 118/65  Pulse: 76 89  Resp: 20 (!) 22  Temp:    SpO2: 97% 95%     General: Awake, no distress.  CV:  Good peripheral perfusion.  Resp:  Normal effort.  No accessory muscle usage or intercostal retractions.  No expiratory wheezing, she has some wheezing but it seems to be from her upper airway, specifically from her throat, and may be partially positional. Abd:  Extreme obesity with a BMI of 80.  No tenderness to palpation of the abdomen. Other:  Mood and affect are blunted but this is her baseline.  No acute abnormalities identified.   ED Results / Procedures / Treatments   Labs (all labs ordered are listed, but only abnormal results are displayed) Labs Reviewed  CBC WITH DIFFERENTIAL/PLATELET - Abnormal; Notable for the following components:       Result Value   RBC 3.68 (*)    Hemoglobin 8.9 (*)    HCT 30.6 (*)    MCH 24.2 (*)    MCHC 29.1 (*)    RDW 16.0 (*)    All other components within normal limits  BASIC METABOLIC PANEL - Abnormal; Notable for the following components:   Glucose, Bld 104 (*)    All other components within normal limits  BRAIN NATRIURETIC PEPTIDE  MAGNESIUM     EKG  ED ECG REPORT I, Hinda Kehr, the attending physician, personally viewed and interpreted this ECG.  Date: 07/14/2022 EKG Time: 00 32 Rate: 92 Rhythm: normal sinus rhythm QRS Axis: normal Intervals: normal ST/T Wave abnormalities: normal Narrative Interpretation: no evidence of acute ischemia    RADIOLOGY I viewed and interpreted the patient's 1 view chest x-ray.  I see no evidence of pneumothorax or lobar pneumonia.  I also read the radiologist's report, which confirmed no acute findings.    PROCEDURES:  Critical Care performed: No  .1-3 Lead EKG Interpretation  Performed by: Hinda Kehr, MD Authorized  by: Hinda Kehr, MD     Interpretation: normal     ECG rate:  88   ECG rate assessment: normal     Rhythm: sinus rhythm     Ectopy: none     Conduction: normal      MEDICATIONS ORDERED IN ED: Medications  ipratropium-albuterol (DUONEB) 0.5-2.5 (3) MG/3ML nebulizer solution 3 mL (3 mLs Nebulization Given 07/14/22 0042)  ipratropium-albuterol (DUONEB) 0.5-2.5 (3) MG/3ML nebulizer solution 3 mL (3 mLs Nebulization Given 07/14/22 0042)  ipratropium-albuterol (DUONEB) 0.5-2.5 (3) MG/3ML nebulizer solution 3 mL (3 mLs Nebulization Given 07/14/22 0042)  methylPREDNISolone sodium succinate (SOLU-MEDROL) 125 mg/2 mL injection 125 mg (125 mg Intravenous Given 07/14/22 0055)     IMPRESSION / MDM / ASSESSMENT AND PLAN / ED COURSE  I reviewed the triage vital signs and the nursing notes.                              Differential diagnosis includes, but is not limited to, asthma exacerbation, pickwickian syndrome,  community-acquired pneumonia, pneumothorax, neck/throat infection.  Patient's presentation is most consistent with acute presentation with potential threat to life or bodily function.  Vital stable other than some tachypnea.  Lungs are clear to auscultation although she has a little bit of wheezing that seems to be coming from her upper airway.  Given her history and body habitus, which certainly contribute to her respiratory difficulties, I am treating aggressively with DuoNebs x3 and Solu-Medrol 125 mg IV.  I will then reassess.  Lung/studies ordered: 1 view chest x-ray, magnesium, CBC with differential, basic metabolic panel, BNP.  Chest x-ray unremarkable.  The patient is on the cardiac monitor to evaluate for evidence of arrhythmia and/or significant heart rate changes.  Labs are all reassuring.  She has chronic anemia which is roughly stable, no leukocytosis, no elevated BNP, normal electrolytes on basic metabolic panel.   Clinical Course as of 07/14/22 0245  Sun Jul 14, 2022  0213 Patient is feeling better after her breathing treatments [CF]  0227 I reassessed the patient and she is having no wheezing either in her lungs or her neck.  She says she feels much better and is ready to go home.  I confirmed that she has plenty of breathing treatments at home and inhalers.  I will give her a short course of prednisone.  She will follow-up with her regular doctor.  I gave my usual and customary return precautions. [CF]    Clinical Course User Index [CF] Hinda Kehr, MD     FINAL CLINICAL IMPRESSION(S) / ED DIAGNOSES   Final diagnoses:  Mild intermittent asthma with exacerbation     Rx / DC Orders   ED Discharge Orders          Ordered    predniSONE (DELTASONE) 20 MG tablet  Daily with breakfast        07/14/22 0245             Note:  This document was prepared using Dragon voice recognition software and may include unintentional dictation errors.   Hinda Kehr,  MD 07/14/22 [phone]

## 2022-07-23 ENCOUNTER — Ambulatory Visit: Payer: BC Managed Care – PPO | Admitting: Physician Assistant

## 2022-07-24 ENCOUNTER — Observation Stay
Admission: EM | Admit: 2022-07-24 | Discharge: 2022-07-25 | Disposition: A | Discharge status: Home / Self Care | Payer: BC Managed Care – PPO | Attending: Internal Medicine | Admitting: Internal Medicine

## 2022-07-24 ENCOUNTER — Other Ambulatory Visit: Payer: Self-pay

## 2022-07-24 ENCOUNTER — Emergency Department: Payer: BC Managed Care – PPO

## 2022-07-24 DIAGNOSIS — R0789 Other chest pain: Secondary | ICD-10-CM | POA: Diagnosis not present

## 2022-07-24 DIAGNOSIS — Z6841 Body Mass Index (BMI) 40.0 and over, adult: Secondary | ICD-10-CM

## 2022-07-24 DIAGNOSIS — R0989 Other specified symptoms and signs involving the circulatory and respiratory systems: Secondary | ICD-10-CM

## 2022-07-24 DIAGNOSIS — I4729 Other ventricular tachycardia: Secondary | ICD-10-CM

## 2022-07-24 DIAGNOSIS — R0602 Shortness of breath: Secondary | ICD-10-CM

## 2022-07-24 DIAGNOSIS — I10 Essential (primary) hypertension: Secondary | ICD-10-CM | POA: Diagnosis not present

## 2022-07-24 DIAGNOSIS — Z87898 Personal history of other specified conditions: Secondary | ICD-10-CM

## 2022-07-24 DIAGNOSIS — E119 Type 2 diabetes mellitus without complications: Secondary | ICD-10-CM | POA: Insufficient documentation

## 2022-07-24 DIAGNOSIS — D509 Iron deficiency anemia, unspecified: Secondary | ICD-10-CM | POA: Insufficient documentation

## 2022-07-24 DIAGNOSIS — F411 Generalized anxiety disorder: Secondary | ICD-10-CM | POA: Diagnosis present

## 2022-07-24 DIAGNOSIS — J45901 Unspecified asthma with (acute) exacerbation: Secondary | ICD-10-CM | POA: Diagnosis not present

## 2022-07-24 DIAGNOSIS — J4551 Severe persistent asthma with (acute) exacerbation: Principal | ICD-10-CM | POA: Insufficient documentation

## 2022-07-24 DIAGNOSIS — J811 Chronic pulmonary edema: Secondary | ICD-10-CM | POA: Diagnosis not present

## 2022-07-24 DIAGNOSIS — Z7951 Long term (current) use of inhaled steroids: Secondary | ICD-10-CM | POA: Diagnosis not present

## 2022-07-24 DIAGNOSIS — Z20822 Contact with and (suspected) exposure to covid-19: Secondary | ICD-10-CM | POA: Insufficient documentation

## 2022-07-24 DIAGNOSIS — R079 Chest pain, unspecified: Secondary | ICD-10-CM | POA: Diagnosis not present

## 2022-07-24 DIAGNOSIS — Z79899 Other long term (current) drug therapy: Secondary | ICD-10-CM | POA: Insufficient documentation

## 2022-07-24 DIAGNOSIS — G4733 Obstructive sleep apnea (adult) (pediatric): Secondary | ICD-10-CM | POA: Diagnosis present

## 2022-07-24 DIAGNOSIS — G4730 Sleep apnea, unspecified: Secondary | ICD-10-CM | POA: Diagnosis present

## 2022-07-24 DIAGNOSIS — R0689 Other abnormalities of breathing: Secondary | ICD-10-CM | POA: Diagnosis not present

## 2022-07-24 MED ORDER — IPRATROPIUM-ALBUTEROL 0.5-2.5 (3) MG/3ML IN SOLN
3.0000 mL | Freq: Once | RESPIRATORY_TRACT | Status: AC
  Administered 2022-07-25: 3 mL via RESPIRATORY_TRACT
  Filled 2022-07-24: qty 3

## 2022-07-24 MED ORDER — METHYLPREDNISOLONE SODIUM SUCC 125 MG IJ SOLR
125.0000 mg | Freq: Once | INTRAMUSCULAR | Status: AC
  Administered 2022-07-25: 125 mg via INTRAVENOUS
  Filled 2022-07-24: qty 2

## 2022-07-24 NOTE — ED Triage Notes (Signed)
Pt via [facility] from home c/o chest pressure and SOB since 3pm this afternoon. EMS administered 341m aspirin en route but was unable to hear lung sounds. Pt appears SOB/dyspneic on arrival. Currently reporting 6/10 left chest pain radiating to neck.    '324mg'$  ASA RR 44 decreased to 32 ETCO2 28 increased to 38 en route CBG 136 T 97.9 HR 97 O2 99 BP 125/61

## 2022-07-24 NOTE — ED Provider Notes (Signed)
[facility] Provider Note    Event Date/Time   First MD Initiated Contact with Patient 07/24/22 2341     (approximate)   History   Shortness of Breath   HPI  Sabrina Bell is a 21 y.o. female brought to the ED via EMS from home with a chief complaint of chest pain and shortness of breath since approximately 3 PM.  Patient with a history of morbid obesity, pickwickian syndrome, asthma, diabetes who was administered 324 mg baby aspirin prior to arrival.  EMS reports room air saturation 98%.  Placed on supplemental oxygen for symptomatic relief of symptoms.  Endorses left chest tightness radiating to her neck.  Denies fever, cough, abdominal pain, nausea, vomiting or dizziness.     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 05/23/2022   Pickwickian syndrome ([place]) 05/23/2022   Iron deficiency 05/17/2020   Chronic iron deficiency anemia 02/03/2020   Prediabetes 02/03/2020   Asthma exacerbation 02/03/2020   Acute exacerbation of severe persistent extrinsic asthma 01/28/2020   Asthma-with history of status asthmaticus 08/13/2019   Abnormal abdominal ultrasound 08/13/2019   Menorrhagia 08/10/2019   Abdominal pain 08/10/2019   Status asthmaticus 11/05/2018   Amenorrhea 11/05/2018   Social isolation 11/05/2018   Educational circumstances 11/05/2018   OSA (obstructive sleep apnea) 05/27/2017   Hypersomnia    Morbid obesity with BMI of 70 and over, adult [facility])    Essential hypertension    Acanthosis nigricans, acquired    Dyspepsia    Anemia    Adjustment reaction with anxiety and depression    Acute respiratory failure with hypoxia ([place])    Insomnia 11/29/2015    Generalized anxiety disorder 11/29/2015   MDD (major depressive disorder), recurrent episode, severe ([place]) 11/28/2015     Past Surgical History  No past surgical history on file.   Home Medications   Prior to Admission medications   Medication Sig Start Date End Date Taking? Authorizing Provider  albuterol (VENTOLIN HFA) 108 (90 Base) MCG/ACT inhaler Inhale 2 puffs into the lungs every 4 (four) hours as needed for wheezing or shortness of breath. 06/08/22   Duffy Bruce, MD  EPINEPHrine 0.3 mg/0.3 mL IJ SOAJ injection Inject 0.3 mLs (0.3 mg total) into the muscle as needed for anaphylaxis. 04/28/20   Rudene Re, MD  ferrous sulfate 325 (65 FE) MG EC tablet Take 1 tablet (325 mg total) by mouth daily with breakfast. 06/08/22 07/08/22  Duffy Bruce, MD  medroxyPROGESTERone (PROVERA) 10 MG tablet Take 1 tablet (10 mg total) by mouth daily. 07/09/22   Harlin Heys, MD  Spacer/Aero-Holding Chambers (AEROCHAMBER PLUS) inhaler Use as instructed 05/30/20   Melynda Ripple, MD  fluticasone (FLOVENT HFA) 110 MCG/ACT inhaler Inhale 2 puffs into the lungs 2 (two) times daily. 11/06/18 05/30/20  Wilber Oliphant, MD     Allergies  Cashew nut oil, Omnicef [cefdinir], Other, and Watermelon flavor [flavoring agent]   Family History   Family History  Problem Relation Age of Onset   Diabetes Mother    Vision loss Mother    Ovarian cancer Paternal Grandmother    Prostate cancer Paternal Grandfather  Physical Exam  Triage Vital Signs: ED Triage Vitals  Enc Vitals Group     BP      Pulse      Resp      Temp      Temp src      SpO2      Weight      Height      Head Circumference      Peak Flow      Pain Score      Pain Loc      Pain Edu?      Excl. in [place]?     Updated Vital Signs: There were no vitals taken for this visit.   General: Awake, moderate distress.  CV:  RRR. Good peripheral perfusion.  Resp:  Increased effort.  Hyperventilating.  Distant lung  sounds. Abd:  Morbidly obese.  No distention.  Other:  Bilateral calves are nontender and nonswollen.   ED Results / Procedures / Treatments  Labs (all labs ordered are listed, but only abnormal results are displayed) Labs Reviewed  RESP PANEL BY RT-PCR (FLU A&B, COVID) ARPGX2  CBC WITH DIFFERENTIAL/PLATELET  COMPREHENSIVE METABOLIC PANEL  LIPASE, BLOOD  BLOOD GAS, ARTERIAL  TROPONIN I (HIGH SENSITIVITY)     EKG  ED ECG REPORT I, Brandan Robicheaux J, the attending physician, personally viewed and interpreted this ECG.   Date: 07/24/2022  EKG Time: 2348  Rate: 96  Rhythm: normal sinus rhythm  Axis: Normal  Intervals: QTc 575  ST&T Change: Nonspecific Prolonged QTc compared with EKG dated 07/14/2022   RADIOLOGY I have independently visualized and interpreted patient's chest x-ray as well as noted the radiology interpretation:  Chest x-ray:  Official radiology report(s): No results found.   PROCEDURES:  Critical Care performed: {CriticalCareYesNo:19197::"Yes, see critical care procedure note(s)","No"}  .1-3 Lead EKG Interpretation  Performed by: Paulette Blanch, MD Authorized by: Paulette Blanch, MD   Comments:     Patient placed on cardiac monitor to evaluate for arrhythmias    MEDICATIONS ORDERED IN ED: Medications  methylPREDNISolone sodium succinate (SOLU-MEDROL) 125 mg/2 mL injection 125 mg (has no administration in time range)  ipratropium-albuterol (DUONEB) 0.5-2.5 (3) MG/3ML nebulizer solution 3 mL (has no administration in time range)     IMPRESSION / MDM / ASSESSMENT AND PLAN / ED COURSE  I reviewed the triage vital signs and the nursing notes.                             21 year old female presenting with chest pain and shortness of breath. Differential includes, but is not limited to, viral syndrome, bronchitis including COPD exacerbation, pneumonia, reactive airway disease including asthma, CHF including exacerbation with or without pulmonary/interstitial  edema, pneumothorax, ACS, thoracic trauma, and pulmonary embolism.  I have personally reviewed patient's records and note she last had an emergency department visit on 07/14/2022 for asthma exacerbation.  Patient's presentation is most consistent with exacerbation of chronic illness.  The patient is on the cardiac monitor to evaluate for evidence of arrhythmia and/or significant heart rate changes.  We will obtain basic labs, chest x-ray, respiratory panel.  Administer 125 mg IV Solu-Medrol, DuoNeb and reassess.      FINAL CLINICAL IMPRESSION(S) / ED DIAGNOSES   Final diagnoses:  Chest pain, unspecified type  Shortness of breath     Rx / DC Orders   ED Discharge Orders     None  Note:  This document was prepared using Dragon voice recognition software and may include unintentional dictation errors. 

## 2022-07-25 ENCOUNTER — Observation Stay (HOSPITAL_BASED_OUTPATIENT_CLINIC_OR_DEPARTMENT_OTHER)
Admit: 2022-07-25 | Discharge: 2022-07-25 | Disposition: A | Discharge status: Home / Self Care | Payer: BC Managed Care – PPO | Attending: Cardiovascular Disease | Admitting: Cardiovascular Disease

## 2022-07-25 ENCOUNTER — Ambulatory Visit: Payer: BC Managed Care – PPO | Admitting: Internal Medicine

## 2022-07-25 DIAGNOSIS — J45909 Unspecified asthma, uncomplicated: Secondary | ICD-10-CM | POA: Diagnosis not present

## 2022-07-25 DIAGNOSIS — J45901 Unspecified asthma with (acute) exacerbation: Secondary | ICD-10-CM | POA: Diagnosis present

## 2022-07-25 DIAGNOSIS — Z6841 Body Mass Index (BMI) 40.0 and over, adult: Secondary | ICD-10-CM | POA: Diagnosis not present

## 2022-07-25 DIAGNOSIS — R079 Chest pain, unspecified: Secondary | ICD-10-CM

## 2022-07-25 DIAGNOSIS — I4729 Other ventricular tachycardia: Secondary | ICD-10-CM

## 2022-07-25 DIAGNOSIS — Z87898 Personal history of other specified conditions: Secondary | ICD-10-CM

## 2022-07-25 LAB — COMPREHENSIVE METABOLIC PANEL
ALT: 20 U/L (ref 0–44)
AST: 17 U/L (ref 15–41)
Albumin: 3.1 g/dL — ABNORMAL LOW (ref 3.5–5.0)
Alkaline Phosphatase: 47 U/L (ref 38–126)
Anion gap: 6 (ref 5–15)
BUN: 10 mg/dL (ref 6–20)
CO2: 23 mmol/L (ref 22–32)
Calcium: 8.8 mg/dL — ABNORMAL LOW (ref 8.9–10.3)
Chloride: 111 mmol/L (ref 98–111)
Creatinine, Ser: 0.68 mg/dL (ref 0.44–1.00)
GFR, Estimated: >60 mL/min (ref >60)
Glucose, Bld: 114 mg/dL — ABNORMAL HIGH (ref 70–99)
Potassium: 3.8 mmol/L (ref 3.5–5.1)
Sodium: 140 mmol/L (ref 135–145)
Total Bilirubin: 0.4 mg/dL (ref 0.3–1.2)
Total Protein: 7.3 g/dL (ref 6.5–8.1)

## 2022-07-25 LAB — CBC WITH DIFFERENTIAL/PLATELET
Abs Immature Granulocytes: 0.02 10*3/uL (ref 0.00–0.07)
Basophils Absolute: 0 10*3/uL (ref 0.0–0.1)
Basophils Relative: 0 %
Eosinophils Absolute: 0.1 10*3/uL (ref 0.0–0.5)
Eosinophils Relative: 1 %
HCT: 31.6 % — ABNORMAL LOW (ref 36.0–46.0)
Hemoglobin: 9.1 g/dL — ABNORMAL LOW (ref 12.0–15.0)
Immature Granulocytes: 0 %
Lymphocytes Relative: 21 %
Lymphs Abs: 2 10*3/uL (ref 0.7–4.0)
MCH: 24.4 pg — ABNORMAL LOW (ref 26.0–34.0)
MCHC: 28.8 g/dL — ABNORMAL LOW (ref 30.0–36.0)
MCV: 84.7 fL (ref 80.0–100.0)
Monocytes Absolute: 0.5 10*3/uL (ref 0.1–1.0)
Monocytes Relative: 5 %
Neutro Abs: 6.9 10*3/uL (ref 1.7–7.7)
Neutrophils Relative %: 73 %
Platelets: 261 10*3/uL (ref 150–400)
RBC: 3.73 MIL/uL — ABNORMAL LOW (ref 3.87–5.11)
RDW: 15.9 % — ABNORMAL HIGH (ref 11.5–15.5)
WBC: 9.5 10*3/uL (ref 4.0–10.5)
nRBC: 0 % (ref 0.0–0.2)

## 2022-07-25 LAB — ECHOCARDIOGRAM COMPLETE
Height: 67 in
S' Lateral: 3.4 cm
Weight: 8160 oz

## 2022-07-25 LAB — TROPONIN I (HIGH SENSITIVITY)
Troponin I (High Sensitivity): 6 ng/L (ref <18)
Troponin I (High Sensitivity): <2 ng/L (ref <18)

## 2022-07-25 LAB — HIV ANTIBODY (ROUTINE TESTING W REFLEX): HIV Screen 4th Generation wRfx: NONREACTIVE

## 2022-07-25 LAB — BLOOD GAS, VENOUS
Acid-Base Excess: 4 mmol/L — ABNORMAL HIGH (ref 0.0–2.0)
Bicarbonate: 29.2 mmol/L — ABNORMAL HIGH (ref 20.0–28.0)
O2 Saturation: 61.8 %
Patient temperature: 37
pCO2, Ven: 45 mmHg (ref 44–60)
pH, Ven: 7.42 (ref 7.25–7.43)
pO2, Ven: 39 mmHg (ref 32–45)

## 2022-07-25 LAB — CBG MONITORING, ED: Glucose-Capillary: 175 mg/dL — ABNORMAL HIGH (ref 70–99)

## 2022-07-25 LAB — LIPASE, BLOOD: Lipase: 27 U/L (ref 11–51)

## 2022-07-25 LAB — RESP PANEL BY RT-PCR (FLU A&B, COVID) ARPGX2
Influenza A by PCR: NEGATIVE
Influenza B by PCR: NEGATIVE
SARS Coronavirus 2 by RT PCR: NEGATIVE

## 2022-07-25 LAB — BRAIN NATRIURETIC PEPTIDE: B Natriuretic Peptide: 60.9 pg/mL (ref 0.0–100.0)

## 2022-07-25 LAB — HEMOGLOBIN A1C
Hgb A1c MFr Bld: 5.8 % — ABNORMAL HIGH (ref 4.8–5.6)
Mean Plasma Glucose: 119.76 mg/dL

## 2022-07-25 MED ORDER — ACETAMINOPHEN 650 MG RE SUPP: 650.0000 mg | Freq: Four times a day (QID) | RECTAL | Status: DC | PRN

## 2022-07-25 MED ORDER — IPRATROPIUM-ALBUTEROL 0.5-2.5 (3) MG/3ML IN SOLN
3.0000 mL | Freq: Once | RESPIRATORY_TRACT | Status: AC
  Administered 2022-07-25: 3 mL via RESPIRATORY_TRACT
  Filled 2022-07-25: qty 3

## 2022-07-25 MED ORDER — IPRATROPIUM-ALBUTEROL 0.5-2.5 (3) MG/3ML IN SOLN
RESPIRATORY_TRACT | Status: AC
  Filled 2022-07-25: qty 3

## 2022-07-25 MED ORDER — FUROSEMIDE 10 MG/ML IJ SOLN
20.0000 mg | Freq: Once | INTRAMUSCULAR | Status: AC
  Administered 2022-07-25: 20 mg via INTRAVENOUS
  Filled 2022-07-25: qty 4

## 2022-07-25 MED ORDER — PREDNISONE 20 MG PO TABS: 40.0000 mg | ORAL_TABLET | Freq: Every day | ORAL | 0 refills | Status: AC

## 2022-07-25 MED ORDER — IPRATROPIUM-ALBUTEROL 0.5-2.5 (3) MG/3ML IN SOLN
3.0000 mL | Freq: Four times a day (QID) | RESPIRATORY_TRACT | Status: DC
  Administered 2022-07-25 (×2): 3 mL via RESPIRATORY_TRACT
  Filled 2022-07-25 (×2): qty 3

## 2022-07-25 MED ORDER — ENOXAPARIN SODIUM 120 MG/0.8ML IJ SOSY
0.5000 mg/kg | PREFILLED_SYRINGE | INTRAMUSCULAR | Status: DC
  Administered 2022-07-25: 117 mg via SUBCUTANEOUS
  Filled 2022-07-25: qty 0.78

## 2022-07-25 MED ORDER — PREDNISONE 20 MG PO TABS
40.0000 mg | ORAL_TABLET | Freq: Every day | ORAL | Status: DC
  Administered 2022-07-25: 40 mg via ORAL
  Filled 2022-07-25: qty 2

## 2022-07-25 MED ORDER — ONDANSETRON HCL 4 MG PO TABS: 4.0000 mg | ORAL_TABLET | Freq: Four times a day (QID) | ORAL | Status: DC | PRN

## 2022-07-25 MED ORDER — ONDANSETRON HCL 4 MG/2ML IJ SOLN: 4.0000 mg | Freq: Four times a day (QID) | INTRAMUSCULAR | Status: DC | PRN

## 2022-07-25 MED ORDER — ACETAMINOPHEN 325 MG PO TABS: 650.0000 mg | ORAL_TABLET | Freq: Four times a day (QID) | ORAL | Status: DC | PRN

## 2022-07-25 MED ORDER — ALBUTEROL SULFATE (2.5 MG/3ML) 0.083% IN NEBU: 2.5000 mg | INHALATION_SOLUTION | RESPIRATORY_TRACT | Status: DC | PRN

## 2022-07-25 MED ORDER — MORPHINE SULFATE (PF) 4 MG/ML IV SOLN: 4.0000 mg | Freq: Once | INTRAVENOUS | Status: DC

## 2022-07-25 MED ORDER — INSULIN ASPART 100 UNIT/ML IJ SOLN
0.0000 [IU] | Freq: Every morning | INTRAMUSCULAR | Status: DC
  Administered 2022-07-25: 4 [IU] via SUBCUTANEOUS
  Filled 2022-07-25: qty 1

## 2022-07-25 NOTE — Assessment & Plan Note (Signed)
Not on any medication currently

## 2022-07-25 NOTE — Progress Notes (Deleted)
HPI  Pt presents to the clinic today to establish care and for management of the conditions listed below.  Asthma:  OSA:  Anxiety and Depression:  HTN with Hx SVT:  Prediabetes:  Anemia:  Insomnia:  Past Medical History:  Diagnosis Date   Asthma    Borderline diabetes    Depression    Diabetes mellitus without complication ([place])    Migraines    Obesity     No current facility-administered medications for this visit.   Current Outpatient Medications  Medication Sig Dispense Refill   albuterol (VENTOLIN HFA) 108 (90 Base) MCG/ACT inhaler Inhale 2 puffs into the lungs every 4 (four) hours as needed for wheezing or shortness of breath. 8 g 0   EPINEPHrine 0.3 mg/0.3 mL IJ SOAJ injection Inject 0.3 mLs (0.3 mg total) into the muscle as needed for anaphylaxis. 1 each 1   ferrous sulfate 325 (65 FE) MG EC tablet Take 1 tablet (325 mg total) by mouth daily with breakfast. 30 tablet 0   ibuprofen (ADVIL) 200 MG tablet Take 200 mg by mouth every 6 (six) hours as needed for headache.     medroxyPROGESTERone (PROVERA) 10 MG tablet Take 1 tablet (10 mg total) by mouth daily. 30 tablet 3   predniSONE (DELTASONE) 20 MG tablet Take 60 mg by mouth daily with breakfast. (Patient not taking: Reported on 07/25/2022)     Spacer/Aero-Holding Chambers (AEROCHAMBER PLUS) inhaler Use as instructed 1 each 2   Facility-Administered Medications Ordered in Other Visits  Medication Dose Route Frequency Provider Last Rate Last Admin   acetaminophen (TYLENOL) tablet 650 mg  650 mg Oral Q6H PRN Athena Masse, MD       Or   acetaminophen (TYLENOL) suppository 650 mg  650 mg Rectal Q6H PRN Athena Masse, MD       albuterol (PROVENTIL) (2.5 MG/3ML) 0.083% nebulizer solution 2.5 mg  2.5 mg Nebulization Q2H PRN Athena Masse, MD       enoxaparin (LOVENOX) injection 117 mg  0.5 mg/kg Subcutaneous Q24H Judd Gaudier V, MD       insulin aspart (novoLOG) injection 0-20 Units  0-20 Units Subcutaneous AC  breakfast Athena Masse, MD       ipratropium-albuterol (DUONEB) 0.5-2.5 (3) MG/3ML nebulizer solution 3 mL  3 mL Nebulization Q6H Judd Gaudier V, MD       morphine (PF) 4 MG/ML injection 4 mg  4 mg Intravenous Once Paulette Blanch, MD       ondansetron [facility]) tablet 4 mg  4 mg Oral Q6H PRN Athena Masse, MD       Or   ondansetron [facility]) injection 4 mg  4 mg Intravenous Q6H PRN Athena Masse, MD       predniSONE (DELTASONE) tablet 40 mg  40 mg Oral Q breakfast Athena Masse, MD        Allergies  Allergen Reactions   Cashew Nut Oil Anaphylaxis   Citrullus Vulgaris Itching   Omnicef [Cefdinir] Hives and Other (See Comments)   Other Other (See Comments)    Allergic to walnuts, pecans and almonds per allergy test   Watermelon Flavor [Flavoring Agent] Other (See Comments)    Felt like her throat was itchy and closing    Family History  Problem Relation Age of Onset   Diabetes Mother    Vision loss Mother    Ovarian cancer Paternal Grandmother    Prostate cancer Paternal Grandfather     Social History  Socioeconomic History   Marital status: Single    Spouse name: Not on file   Number of children: Not on file   Years of education: Not on file   Highest education level: Not on file  Occupational History   Not on file  Tobacco Use   Smoking status: Never   Smokeless tobacco: Never  Vaping Use   Vaping Use: Never used  Substance and Sexual Activity   Alcohol use: No   Drug use: No   Sexual activity: Never    Birth control/protection: Other-see comments    Comment: Provera  Other Topics Concern   Not on file  Social History Narrative   Not on file   Social Determinants of Health   Financial Resource Strain: Not on file  Food Insecurity: Not on file  Transportation Needs: Not on file  Physical Activity: Not on file  Stress: Not on file  Social Connections: Not on file  Intimate Partner Violence: Not on file    ROS:  Constitutional: Denies fever,  malaise, fatigue, headache or abrupt weight changes.  HEENT: Denies eye pain, eye redness, ear pain, ringing in the ears, wax buildup, runny nose, nasal congestion, bloody nose, or sore throat. Respiratory: Denies difficulty breathing, shortness of breath, cough or sputum production.   Cardiovascular: Denies chest pain, chest tightness, palpitations or swelling in the hands or feet.  Gastrointestinal: Denies abdominal pain, bloating, constipation, diarrhea or blood in the stool.  GU: Denies frequency, urgency, pain with urination, blood in urine, odor or discharge. Musculoskeletal: Denies decrease in range of motion, difficulty with gait, muscle pain or joint pain and swelling.  Skin: Denies redness, rashes, lesions or ulcercations.  Neurological: Denies dizziness, difficulty with memory, difficulty with speech or problems with balance and coordination.  Psych: Denies anxiety, depression, SI/HI.  No other specific complaints in a complete review of systems (except as listed in HPI above).  PE:  There were no vitals taken for this visit. Wt Readings from Last 3 Encounters:  07/24/22 (!) 510 lb (231.3 kg)  07/14/22 (!) 511 lb (231.8 kg)  06/27/22 (!) 499 lb (226.3 kg)    General: Appears their stated age, well developed, well nourished in NAD. HEENT: Head: normal shape and size; Eyes: sclera white, no icterus, conjunctiva pink, PERRLA and EOMs intact; Ears: Tm's gray and intact, normal light reflex;Throat/Mouth: Teeth present, mucosa pink and moist, no lesions or ulcerations noted.  Neck: Neck supple, trachea midline. No masses, lumps or thyromegaly present.  Cardiovascular: Normal rate and rhythm. S1,S2 noted.  No murmur, rubs or gallops noted. No JVD or BLE edema. No carotid bruits noted. Pulmonary/Chest: Normal effort and positive vesicular breath sounds. No respiratory distress. No wheezes, rales or ronchi noted.  Abdomen: Soft and nontender. Normal bowel sounds, no bruits noted. No  distention or masses noted. Liver, spleen and kidneys non palpable. Musculoskeletal: Normal range of motion. Strength 5/5 BUE/BLE. No signs of joint swelling. No difficulty with gait.  Neurological: Alert and oriented. Cranial nerves II-XII grossly intact. Coordination normal.  Psychiatric: Mood and affect normal. Behavior is normal. Judgment and thought content normal.   EKG:  BMET    Component Value Date/Time   NA 140 07/25/2022 0008   K 3.8 07/25/2022 0008   CL 111 07/25/2022 0008   CO2 23 07/25/2022 0008   GLUCOSE 114 (H) 07/25/2022 0008   BUN 10 07/25/2022 0008   CREATININE 0.68 07/25/2022 0008   CALCIUM 8.8 (L) 07/25/2022 0008   GFRNONAA >60 07/25/2022  0008   GFRAA >60 05/31/2020 1938    Lipid Panel     Component Value Date/Time   CHOL 155 11/05/2018 1112   TRIG 47 11/05/2018 1112   HDL 54 11/05/2018 1112   CHOLHDL 2.9 11/05/2018 1112   VLDL 9 11/05/2018 1112   LDLCALC 92 11/05/2018 1112    CBC    Component Value Date/Time   WBC 9.5 07/25/2022 0008   RBC 3.73 (L) 07/25/2022 0008   HGB 9.1 (L) 07/25/2022 0008   HGB 9.1 (L) 09/22/2020 1505   HCT 31.6 (L) 07/25/2022 0008   HCT 29.4 (L) 09/22/2020 1505   PLT 261 07/25/2022 0008   PLT 325 09/22/2020 1505   MCV 84.7 07/25/2022 0008   MCV 78 (L) 09/22/2020 1505   MCH 24.4 (L) 07/25/2022 0008   MCHC 28.8 (L) 07/25/2022 0008   RDW 15.9 (H) 07/25/2022 0008   RDW 15.7 (H) 09/22/2020 1505   LYMPHSABS 2.0 07/25/2022 0008   MONOABS 0.5 07/25/2022 0008   EOSABS 0.1 07/25/2022 0008   BASOSABS 0.0 07/25/2022 0008    Hgb A1C Lab Results  Component Value Date   HGBA1C 5.8 (H) 09/22/2020     Assessment and Plan:    Webb Silversmith, NP

## 2022-07-25 NOTE — Consult Note (Signed)
Cardiology Consultation:   Patient ID: Sabrina Bell; [id number]; Jun 24, 2001   Admit date: 07/24/2022 Date of Consult: 07/25/2022  Primary Care Provider: Jearld Fenton, NP Primary Cardiologist: Sabrina Bell Primary Electrophysiologist:  None   Patient Profile:   Sabrina Bell is a 21 y.o. female with a hx of asthma, DM2, obesity with a BMI of 79, and anxiety who is being seen today for the evaluation of chest pain at the request of Dr. Damita Bell.  History of Present Illness:   Sabrina Bell underwent echo in 2018 without evidence of significant structural abnormalities, with poor acoustic windows.  She was evaluated by Dr. Garen Bell as a new patient in 05/2022 for evaluation of dizziness, without symptoms of angina or decompensation.  It was noted she had recently been seen in the ED with unrevealing work-up, including high-sensitivity troponins.  Subsequent Zio patch demonstrated a predominant rhythm of sinus with 1 minute of NSVT lasting 9.6 seconds  She presented to [facility] on the evening of 07/24/2022 with a several hour history of left-sided chest discomfort without associated symptoms.  No recent prolonged travels.  No significant lower extremity swelling.  She was at rest when symptoms began.  Symptoms lasted for several hours and resolved upon arrival to the ED.  EKG was nonacute.  High-sensitivity troponin negative.  BNP normal.  Chest x-ray with possible mild pulmonary vascular congestion.  She was treated for asthma exacerbation with DuoNebs, Solu-Medrol, prednisone, and 1 dose of IV Lasix with noted improvement in symptoms.  Currently chest pain-free.  Past Medical History:  Diagnosis Date   Asthma    Borderline diabetes    Depression    Diabetes mellitus without complication ([place])    Migraines    Obesity     No past surgical history on file.   Home Meds: Prior to Admission medications   Medication Sig Start Date End Date Taking? Authorizing Provider  albuterol (VENTOLIN  HFA) 108 (90 Base) MCG/ACT inhaler Inhale 2 puffs into the lungs every 4 (four) hours as needed for wheezing or shortness of breath. 06/08/22  Yes Duffy Bruce, MD  EPINEPHrine 0.3 mg/0.3 mL IJ SOAJ injection Inject 0.3 mLs (0.3 mg total) into the muscle as needed for anaphylaxis. 04/28/20  Yes Alfred Levins, [place], MD  ferrous sulfate 325 (65 FE) MG EC tablet Take 1 tablet (325 mg total) by mouth daily with breakfast. 06/08/22 07/25/22 Yes Duffy Bruce, MD  ibuprofen (ADVIL) 200 MG tablet Take 200 mg by mouth every 6 (six) hours as needed for headache.   Yes [provider]  medroxyPROGESTERone (PROVERA) 10 MG tablet Take 1 tablet (10 mg total) by mouth daily. 07/09/22  Yes Harlin Heys, MD  Spacer/Aero-Holding Chambers (AEROCHAMBER PLUS) inhaler Use as instructed 05/30/20  Yes Melynda Ripple, MD  predniSONE (DELTASONE) 20 MG tablet Take 60 mg by mouth daily with breakfast. Patient not taking: Reported on 07/25/2022    [provider]  fluticasone (FLOVENT HFA) 110 MCG/ACT inhaler Inhale 2 puffs into the lungs 2 (two) times daily. 11/06/18 05/30/20  Wilber Oliphant, MD    Inpatient Medications: Scheduled Meds:  enoxaparin (LOVENOX) injection  0.5 mg/kg Subcutaneous Q24H   insulin aspart  0-20 Units Subcutaneous AC breakfast   ipratropium-albuterol  3 mL Nebulization Q6H    morphine injection  4 mg Intravenous Once   predniSONE  40 mg Oral Q breakfast   Continuous Infusions:  PRN Meds: acetaminophen **OR** acetaminophen, albuterol, ondansetron **OR** ondansetron (ZOFRAN) IV  Allergies:   Allergies  Allergen Reactions   Cashew Nut Oil Anaphylaxis   Citrullus Vulgaris Itching   Omnicef [Cefdinir] Hives and Other (See Comments)   Other Other (See Comments)    Allergic to walnuts, pecans and almonds per allergy test   Watermelon Flavor [Flavoring Agent] Other (See Comments)    Felt like her throat was itchy and closing    Social History:   Social History    Socioeconomic History   Marital status: Single    Spouse name: Not on file   Number of children: Not on file   Years of education: Not on file   Highest education level: Not on file  Occupational History   Not on file  Tobacco Use   Smoking status: Never   Smokeless tobacco: Never  Vaping Use   Vaping Use: Never used  Substance and Sexual Activity   Alcohol use: No   Drug use: No   Sexual activity: Never    Birth control/protection: Other-see comments    Comment: Provera  Other Topics Concern   Not on file  Social History Narrative   Not on file   Social Determinants of Health   Financial Resource Strain: Not on file  Food Insecurity: Not on file  Transportation Needs: Not on file  Physical Activity: Not on file  Stress: Not on file  Social Connections: Not on file  Intimate Partner Violence: Not on file     Family History:   Family History  Problem Relation Age of Onset   Diabetes Mother    Vision loss Mother    Ovarian cancer Paternal Grandmother    Prostate cancer Paternal Grandfather     ROS:  Review of Systems  Constitutional:  Positive for malaise/fatigue. Negative for chills, diaphoresis, fever and weight loss.  HENT:  Negative for congestion.   Eyes:  Negative for discharge and redness.  Respiratory:  Negative for cough, sputum production, shortness of breath and wheezing.   Cardiovascular:  Positive for chest pain. Negative for palpitations, orthopnea, claudication, leg swelling and PND.  Gastrointestinal:  Negative for abdominal pain, heartburn, nausea and vomiting.  Musculoskeletal:  Negative for falls and myalgias.  Skin:  Negative for rash.  Neurological:  Negative for dizziness, tingling, tremors, sensory change, speech change, focal weakness, loss of consciousness and weakness.  Endo/Heme/Allergies:  Does not bruise/bleed easily.  Psychiatric/Behavioral:  Negative for substance abuse. The patient is not nervous/anxious.   All other systems  reviewed and are negative.     Physical Exam/Data:   Vitals:   07/25/22 0800 07/25/22 0830 07/25/22 0850 07/25/22 0900  BP: 132/75   127/68  Pulse: 98   89  Resp: (!) 25   (!) 29  Temp:   97.7 F (36.5 C)   TempSrc:      SpO2: 92% 96%  93%  Weight:      Height:        Intake/Output Summary (Last 24 hours) at 07/25/2022 1131 Last data filed at 07/25/2022 0224 Gross per 24 hour  Intake --  Output 1000 ml  Net -1000 ml   Filed Weights   07/24/22 2349  Weight: (!) 231.3 kg   Body mass index is 79.88 kg/m.   Physical Exam: General: Well developed, well nourished, in no acute distress. Head: Normocephalic, atraumatic, sclera non-icteric, no xanthomas, nares without discharge.  Neck: Negative for carotid bruits. JVD unable to be assessed secondary to body habitus. Lungs: Clear bilaterally to auscultation without wheezes, rales, or rhonchi. Breathing is unlabored. Heart: RRR  with S1 S2. No murmurs, rubs, or gallops appreciated. Abdomen: Soft, non-tender, non-distended with normoactive bowel sounds. No hepatomegaly. No rebound/guarding. No obvious abdominal masses. Msk:  Strength and tone appear normal for age. Extremities: No clubbing or cyanosis. No edema. Distal pedal pulses are 2+ and equal bilaterally. Neuro: Alert and oriented X 3. No facial asymmetry. No focal deficit. Moves all extremities spontaneously. Psych:  Responds to questions appropriately with a normal affect.   EKG:  The EKG was personally reviewed and demonstrates: NSR, 96 bpm, baseline wandering, no acute ST-T changes Telemetry:  Telemetry was personally reviewed and demonstrates: Sinus rhythm with sinus tachycardia  Weights: Filed Weights   07/24/22 2349  Weight: (!) 231.3 kg    Relevant CV Studies:  2D echo pending  Laboratory Data:  Chemistry Recent Labs  Lab 07/25/22 0008  NA 140  K 3.8  CL 111  CO2 23  GLUCOSE 114*  BUN 10  CREATININE 0.68  CALCIUM 8.8*  GFRNONAA >60  ANIONGAP 6     Recent Labs  Lab 07/25/22 0008  PROT 7.3  ALBUMIN 3.1*  AST 17  ALT 20  ALKPHOS 47  BILITOT 0.4   Hematology Recent Labs  Lab 07/25/22 0008  WBC 9.5  RBC 3.73*  HGB 9.1*  HCT 31.6*  MCV 84.7  MCH 24.4*  MCHC 28.8*  RDW 15.9*  PLT 261   Cardiac EnzymesNo results for input(s): "TROPONINI" in the last 168 hours. No results for input(s): "TROPIPOC" in the last 168 hours.  BNP Recent Labs  Lab 07/25/22 0007  BNP 60.9    DDimer No results for input(s): "DDIMER" in the last 168 hours.  Radiology/Studies:  DG Chest Port 1 View  Result Date: 07/25/2022 IMPRESSION: Low lung volumes with mild pulmonary vascular congestion. Electronically Signed   By: Virgina Norfolk M.D.   On: 07/25/2022 00:04    Assessment and Plan:   1.  Chest pain: -Atypical in presentation -Ruled out -No indication for heparin drip -Obtain echo, if this demonstrates preserved LV systolic function and normal wall motion, no indication for inpatient ischemic evaluation -Patient is at increased risk for DVT/PE secondary to morbid obesity with a BMI of 79.88, defer evaluation to internal medicine, currently on therapeutic dose Lovenox  2.  History of NSVT: -Asymptomatic -Echo as above  3.  Morbid obesity: -Would benefit from bariatric evaluation as an outpatient      For questions or updates, please contact [place] Please consult [email or web address] for contact info under Cardiology/STEMI.   Signed, Christell Faith, PA-C [place] Pager: [phone] 07/25/2022, 11:31 AM

## 2022-07-25 NOTE — Assessment & Plan Note (Signed)
Hemoglobin at baseline at 9.1 Continue ferrous sulfate

## 2022-07-25 NOTE — Progress Notes (Signed)
*  PRELIMINARY RESULTS* Echocardiogram 2D Echocardiogram has been performed.  Sherrie Sport 07/25/2022, 9:45 AM

## 2022-07-25 NOTE — Assessment & Plan Note (Addendum)
Schedule and as needed nebulized bronchodilator treatments Steroids

## 2022-07-25 NOTE — Assessment & Plan Note (Signed)
Complicating factor to overall prognosis and care 

## 2022-07-25 NOTE — H&P (Signed)
History and Physical    Patient: Sabrina Bell RWE:[id number] DOB: [birth year] DOA: 07/24/2022 DOS: the patient was seen and examined on 07/25/2022 PCP: Jearld Fenton, NP  Patient coming from: Home  Chief Complaint:  Chief Complaint  Patient presents with   Shortness of Breath    HPI: Sabrina Bell is a 21 y.o. female with medical history significant for Obesity with BMI over 70, GAD, IDA, asthma, OSA, recently evaluated for dizziness with 14-day monitor showing a 9 beat asymptomatic run of V. tach for which no treatment was indicated who presents to the ED with shortness of breath, wheezing associated with chest pressure.  She denied cough, fever or chills.  EMS administered aspirin 324 mg in route. ED course and data review: Vitals within normal limits.  VBG unremarkable, troponin less than 2.  CMP and CBC at baseline, lipase and LFTs WNL EKG, personally reviewed and interpreted showing sinus at 96 with no acute ST-T wave changes.  Chest x-ray showing mild pulmonary vascular congestion. Patient treated with DuoNebs, methylprednisolone and IV Lasix.  Hospitalist consulted for admission.   Review of Systems: As mentioned in the history of present illness. All other systems reviewed and are negative.  Past Medical History:  Diagnosis Date   Asthma    Borderline diabetes    Depression    Diabetes mellitus without complication ([place])    Migraines    Obesity    No past surgical history on file. Social History:  reports that she has never smoked. She has never used smokeless tobacco. She reports that she does not drink alcohol and does not use drugs.  Allergies  Allergen Reactions   Cashew Nut Oil Anaphylaxis   Omnicef [Cefdinir] Hives and Other (See Comments)   Other Other (See Comments)    Allergic to walnuts, pecans and almonds per allergy test   Watermelon Flavor [Flavoring Agent] Other (See Comments)    Felt like her throat was itchy and closing    Family History   Problem Relation Age of Onset   Diabetes Mother    Vision loss Mother    Ovarian cancer Paternal Grandmother    Prostate cancer Paternal Grandfather     Prior to Admission medications   Medication Sig Start Date End Date Taking? Authorizing Provider  albuterol (VENTOLIN HFA) 108 (90 Base) MCG/ACT inhaler Inhale 2 puffs into the lungs every 4 (four) hours as needed for wheezing or shortness of breath. 06/08/22  Yes Duffy Bruce, MD  ferrous sulfate 325 (65 FE) MG EC tablet Take 1 tablet (325 mg total) by mouth daily with breakfast. 06/08/22 07/24/22 Yes Duffy Bruce, MD  medroxyPROGESTERone (PROVERA) 10 MG tablet Take 1 tablet (10 mg total) by mouth daily. 07/09/22  Yes Harlin Heys, MD  EPINEPHrine 0.3 mg/0.3 mL IJ SOAJ injection Inject 0.3 mLs (0.3 mg total) into the muscle as needed for anaphylaxis. 04/28/20   Rudene Re, MD  predniSONE (DELTASONE) 20 MG tablet Take 60 mg by mouth daily with breakfast. Patient not taking: Reported on 07/24/2022    [provider]  Spacer/Aero-Holding Chambers (AEROCHAMBER PLUS) inhaler Use as instructed 05/30/20   Melynda Ripple, MD  fluticasone (FLOVENT HFA) 110 MCG/ACT inhaler Inhale 2 puffs into the lungs 2 (two) times daily. 11/06/18 05/30/20  Wilber Oliphant, MD    Physical Exam: Vitals:   07/25/22 0130 07/25/22 0215 07/25/22 0237 07/25/22 0247  BP: 124/68   132/75  Pulse: 99 92 95 90  Resp: (!) 26 (!) 26  19 (!) 23  Temp:    98.4 F (36.9 C)  TempSrc:    Oral  SpO2: 95% 97% 95% 93%  Weight:      Height:       Physical Exam Vitals and nursing note reviewed.  Constitutional:      General: She is not in acute distress.    Appearance: She is obese.  HENT:     Head: Normocephalic and atraumatic.  Cardiovascular:     Rate and Rhythm: Normal rate and regular rhythm.     Heart sounds: Normal heart sounds.  Pulmonary:     Effort: Pulmonary effort is normal.     Breath sounds: Wheezing present.  Abdominal:      Palpations: Abdomen is soft.     Tenderness: There is no abdominal tenderness.  Neurological:     Mental Status: Mental status is at baseline.     Labs on Admission: I have personally reviewed following labs and imaging studies  CBC: Recent Labs  Lab 07/25/22 0008  WBC 9.5  NEUTROABS 6.9  HGB 9.1*  HCT 31.6*  MCV 84.7  PLT 938   Basic Metabolic Panel: Recent Labs  Lab 07/25/22 0008  NA 140  K 3.8  CL 111  CO2 23  GLUCOSE 114*  BUN 10  CREATININE 0.68  CALCIUM 8.8*   GFR: Estimated Creatinine Clearance: 229.3 mL/min (by C-G formula based on SCr of 0.68 mg/dL). Liver Function Tests: Recent Labs  Lab 07/25/22 0008  AST 17  ALT 20  ALKPHOS 47  BILITOT 0.4  PROT 7.3  ALBUMIN 3.1*   Recent Labs  Lab 07/25/22 0008  LIPASE 27   No results for input(s): "AMMONIA" in the last 168 hours. Coagulation Profile: No results for input(s): "INR", "PROTIME" in the last 168 hours. Cardiac Enzymes: No results for input(s): "CKTOTAL", "CKMB", "CKMBINDEX", "TROPONINI" in the last 168 hours. BNP (last 3 results) No results for input(s): "PROBNP" in the last 8760 hours. HbA1C: No results for input(s): "HGBA1C" in the last 72 hours. CBG: No results for input(s): "GLUCAP" in the last 168 hours. Lipid Profile: No results for input(s): "CHOL", "HDL", "LDLCALC", "TRIG", "CHOLHDL", "LDLDIRECT" in the last 72 hours. Thyroid Function Tests: No results for input(s): "TSH", "T4TOTAL", "FREET4", "T3FREE", "THYROIDAB" in the last 72 hours. Anemia Panel: No results for input(s): "VITAMINB12", "FOLATE", "FERRITIN", "TIBC", "IRON", "RETICCTPCT" in the last 72 hours. Urine analysis:    Component Value Date/Time   COLORURINE YELLOW (A) 05/16/2022 0026   APPEARANCEUR CLOUDY (A) 05/16/2022 0026   LABSPEC 1.029 05/16/2022 0026   PHURINE 5.0 05/16/2022 0026   GLUCOSEU NEGATIVE 05/16/2022 0026   HGBUR NEGATIVE 05/16/2022 0026   BILIRUBINUR NEGATIVE 05/16/2022 0026   KETONESUR NEGATIVE  05/16/2022 0026   PROTEINUR 30 (A) 05/16/2022 0026   NITRITE NEGATIVE 05/16/2022 0026   LEUKOCYTESUR NEGATIVE 05/16/2022 0026    Radiological Exams on Admission: DG Chest Port 1 View  Result Date: 07/25/2022 CLINICAL DATA:  Shortness of breath. EXAM: PORTABLE CHEST 1 VIEW COMPARISON:  July 14, 2022 FINDINGS: The cardiac silhouette is mildly enlarged and unchanged in size. Low lung volumes are noted with subsequent crowding of the bronchovascular lung markings. Mild suprahilar and infrahilar prominence of the pulmonary vasculature is seen. There is no evidence of focal consolidation, pleural effusion or pneumothorax. The visualized skeletal structures are unremarkable. IMPRESSION: Low lung volumes with mild pulmonary vascular congestion. Electronically Signed   By: Virgina Norfolk M.D.   On: 07/25/2022 00:04  Data Reviewed: Relevant notes from primary care and specialist visits, past discharge summaries as available in EHR, including Care Everywhere. Prior diagnostic testing as pertinent to current admission diagnoses Updated medications and problem lists for reconciliation ED course, including vitals, labs, imaging, treatment and response to treatment Triage notes, nursing and pharmacy notes and ED provider's notes Notable results as noted in HPI   Assessment and Plan: * Acute exacerbation of extrinsic asthma Schedule and as needed nebulized bronchodilator treatments Steroids  Chest pain Patient reports chest pressure.  EKG nonacute and troponin less than 2 Likely noncardiac Given recent complaints of dizziness, mild congestion on chest x-ray, recent NSVT on Zio patch, will get echocardiogram and consult cardiology We will get baseline BNP  History of NSVT (nonsustained ventricular tachycardia) ([place]) S/p Zio patch September 2023 indicated for dizziness showing 9 beat run of asymptomatic V. tach No treatment instituted for same.  Patient was seen by [facility]  cardiology.  History of prediabetes Monitor blood sugar in the setting of steroid treatment  Chronic iron deficiency anemia Hemoglobin at baseline at 9.1 Continue ferrous sulfate  OSA (obstructive sleep apnea) Not on CPAP  Morbid obesity with BMI of 70 and over, adult ([place]) Complicating factor to overall prognosis and care  Generalized anxiety disorder Not on any medication currently        DVT prophylaxis: Lovenox  Consults: none  Advance Care Planning:   Code Status: Prior   Family Communication: none  Disposition Plan: Back to previous home environment  Severity of Illness: The appropriate patient status for this patient is OBSERVATION. Observation status is judged to be reasonable and necessary in order to provide the required intensity of service to ensure the patient's safety. The patient's presenting symptoms, physical exam findings, and initial radiographic and laboratory data in the context of their medical condition is felt to place them at decreased risk for further clinical deterioration. Furthermore, it is anticipated that the patient will be medically stable for discharge from the hospital within 2 midnights of admission.   Author: Athena Masse, MD 07/25/2022 3:05 AM  For on call review [email or web address].

## 2022-07-25 NOTE — Progress Notes (Signed)
PHARMACIST - PHYSICIAN COMMUNICATION  CONCERNING:  Enoxaparin (Lovenox) for DVT Prophylaxis    RECOMMENDATION: Patient was prescribed enoxaprin '40mg'$  q24 hours for VTE prophylaxis.   Filed Weights   07/24/22 2349  Weight: (!) 231.3 kg (510 lb)    Body mass index is 79.88 kg/m.  Estimated Creatinine Clearance: 229.3 mL/min (by C-G formula based on SCr of 0.68 mg/dL).   Based on [place] patient is candidate for enoxaparin 0.'5mg'$ /kg TBW SQ every 24 hours based on BMI being >30.  DESCRIPTION: Pharmacy has adjusted enoxaparin dose per [facility] policy.  Patient is now receiving enoxaparin 0.5 mg/kg every 24 hours   Renda Rolls, PharmD, [facility] 07/25/2022 3:25 AM

## 2022-07-25 NOTE — Assessment & Plan Note (Signed)
S/p Zio patch September 2023 indicated for dizziness showing 9 beat run of asymptomatic V. tach No treatment instituted for same.  Patient was seen by [facility] cardiology.

## 2022-07-25 NOTE — Discharge Summary (Signed)
Physician Discharge Summary   Patient: Sabrina Bell MRN: [id number] DOB: 04/10/2001  Admit date:     07/24/2022  Discharge date: 07/25/22  Discharge Physician: Lorella Nimrod   PCP: Jearld Fenton, NP   Recommendations at discharge:  Follow-up with PCP within a week Please arrange her to be seen at the weight reduction clinic. Patient need close monitoring of A1c.  Discharge Diagnoses: Active Problems:   History of NSVT (nonsustained ventricular tachycardia) (HCC)   Morbid obesity with BMI of 70 and over, adult (HCC)   OSA (obstructive sleep apnea)  Principal Problem (Resolved):   Acute exacerbation of extrinsic asthma Resolved Problems:   Chest pain   Generalized anxiety disorder   Chronic iron deficiency anemia   History of prediabetes  Hospital Course: Taken from H&P.  Sabrina Bell is a 21 y.o. female with medical history significant for Obesity with BMI over 70, GAD, IDA, asthma, OSA, recently evaluated for dizziness with 14-day monitor showing a 9 beat asymptomatic run of V. tach for which no treatment was indicated who presents to the ED with shortness of breath, wheezing associated with chest pressure.  She denied cough, fever or chills.  EMS administered aspirin 324 mg in route. ED course and data review: Vitals within normal limits.  VBG unremarkable, troponin less than 2.  CMP and CBC at baseline, lipase and LFTs WNL EKG, personally reviewed and interpreted showing sinus at 96 with no acute ST-T wave changes.  Chest x-ray showing mild pulmonary vascular congestion. Patient treated with DuoNebs, methylprednisolone and IV Lasix.   Patient remained stable.  Saturating well on room air.  She is morbidly obese with BMI close to 80 which is most likely contributory to her feeling of shortness of breath.  No wheezing today when seen during morning rounds. A1c of 5.8 which makes her prediabetic. Cardiology was also consulted from ED for her complaint of chest tightness,  troponin remain negative and no acute changes on EKG. Echocardiogram without any significant abnormality.  Patient need to be seen at a specialized weight reduction clinic to help reducing the weight.  Not sure whether she has underlying congenital disorder for being that overweight at this age.  Patient is being discharged on 3 more days of prednisone.  She was instructed to watch her diet as it can raise her blood glucose level. She will also instructed to follow-up at a weight reduction clinic.  PCP should be able to help.  Based on her morbid obesity she will be high risk for multiple other comorbidities which can limit her lifespan.    Assessment and Plan: * Acute exacerbation of extrinsic asthma-resolved as of 07/25/2022 Schedule and as needed nebulized bronchodilator treatments Steroids  History of NSVT (nonsustained ventricular tachycardia) ([place]) S/p Zio patch September 2023 indicated for dizziness showing 9 beat run of asymptomatic V. tach No treatment instituted for same.  Patient was seen by [facility] cardiology.  Chest pain-resolved as of 07/25/2022 Patient reports chest pressure.  EKG nonacute and troponin less than 2 Likely noncardiac Given recent complaints of dizziness, mild congestion on chest x-ray, recent NSVT on Zio patch, will get echocardiogram and consult cardiology We will get baseline BNP  OSA (obstructive sleep apnea) Not on CPAP  Morbid obesity with BMI of 70 and over, adult [facility]) Complicating factor to overall prognosis and care  History of prediabetes-resolved as of 07/25/2022 Monitor blood sugar in the setting of steroid treatment  Chronic iron deficiency anemia-resolved as of 07/25/2022 Hemoglobin at baseline  at 9.1 Continue ferrous sulfate  Generalized anxiety disorder-resolved as of 07/25/2022 Not on any medication currently   Consultants: Cardiology Procedures performed: None Disposition: Home Diet recommendation:  Discharge Diet Orders (From  admission, onward)     Start     Ordered   07/25/22 0000  Diet - low sodium heart healthy        07/25/22 1516           Cardiac and Carb modified diet DISCHARGE MEDICATION: Allergies as of 07/25/2022       Reactions   Cashew Nut Oil Anaphylaxis   Citrullus Vulgaris Itching   Omnicef [cefdinir] Hives, Other (See Comments)   Other Other (See Comments)   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   EPINEPHrine 0.3 mg/0.3 mL Soaj injection Commonly known as: EPI-PEN Inject 0.3 mLs (0.3 mg total) into the muscle as needed for anaphylaxis.   ferrous sulfate 325 (65 FE) MG EC tablet Take 1 tablet (325 mg total) by mouth daily with breakfast.   ibuprofen 200 MG tablet Commonly known as: ADVIL Take 200 mg by mouth every 6 (six) hours as needed for headache.   medroxyPROGESTERone 10 MG tablet Commonly known as: Provera Take 1 tablet (10 mg total) by mouth daily.   predniSONE 20 MG tablet Commonly known as: DELTASONE Take 2 tablets (40 mg total) by mouth daily with breakfast for 3 days. Start taking on: July 26, 2022 What changed: how much to take        Follow-up Information     Garnette Gunner, Coralie Keens, NP. Schedule an appointment as soon as possible for a visit in 1 week(s).   Specialties: Internal Medicine, Emergency Medicine Contact information: [place] [address] [phone]                Discharge Exam: Danley Danker Weights   07/24/22 2349  Weight: (!) 231.3 kg   General.  Morbidly obese young lady, in no acute distress. Pulmonary.  Lungs clear  bilaterally, normal respiratory effort. CV.  Regular rate and rhythm, no JVD, rub or murmur. Abdomen.  Soft, nontender, nondistended, BS positive. CNS.  Alert and oriented .  No focal neurologic deficit. Extremities.  No edema, no cyanosis, pulses intact and symmetrical. Psychiatry.  Judgment and insight appears normal.   Condition at discharge: stable  The results of significant diagnostics from this hospitalization (including imaging, microbiology, ancillary and laboratory) are listed below for reference.   Imaging Studies: ECHOCARDIOGRAM COMPLETE  Result Date: 07/25/2022    ECHOCARDIOGRAM REPORT   Patient Name:   Sabrina Bell Date of Exam: 07/25/2022 Medical Rec #:  [id number]       Height:       67.0 in Accession #:    [id number]      Weight:       510.0 lb Date of Birth:  2001/06/15       BSA:          3.010 m Patient Age:    81 years  BP:           115/62 mmHg Patient Gender: F               HR:           83 bpm. Exam Location:  [facility] Procedure: 2D Echo, Cardiac Doppler and Color Doppler Indications:     Chest pain R07.9  History:         Patient has prior history of Echocardiogram examinations, most                  recent 02/17/2017. Risk Factors:Diabetes. Migraines.  Sonographer:     Sherrie Sport Referring Phys:  [place] Diagnosing Phys: Ida Rogue MD  Sonographer Comments: Technically difficult study due to poor echo windows and no subcostal window. IMPRESSIONS  1. Left ventricular ejection fraction, by estimation, is 60 to 65%. The left ventricle has normal function. The left ventricle has no regional wall motion abnormalities. Left ventricular diastolic parameters are indeterminate.  2. Right ventricular systolic function is grossly normal. The right ventricular size is normal. Tricuspid regurgitation signal is inadequate for assessing PA pressure.  3. The mitral valve was not well visualized. No evidence of mitral valve regurgitation. No evidence of mitral stenosis.   4. The aortic valve was not well visualized. Aortic valve regurgitation is not visualized. No aortic stenosis is present.  5. The inferior vena cava is normal in size with greater than 50% respiratory variability, suggesting right atrial pressure of 3 mmHg. FINDINGS  Left Ventricle: Left ventricular ejection fraction, by estimation, is 60 to 65%. The left ventricle has normal function. The left ventricle has no regional wall motion abnormalities. The left ventricular internal cavity size was normal in size. There is  no left ventricular hypertrophy. Left ventricular diastolic parameters are indeterminate. Right Ventricle: The right ventricular size is normal. No increase in right ventricular wall thickness. Right ventricular systolic function is normal. Tricuspid regurgitation signal is inadequate for assessing PA pressure. Left Atrium: Left atrial size was normal in size. Right Atrium: Right atrial size was normal in size. Pericardium: There is no evidence of pericardial effusion. Mitral Valve: The mitral valve was not well visualized. No evidence of mitral valve regurgitation. No evidence of mitral valve stenosis. Tricuspid Valve: The tricuspid valve is not well visualized. Tricuspid valve regurgitation is not demonstrated. No evidence of tricuspid stenosis. Aortic Valve: The aortic valve was not well visualized. Aortic valve regurgitation is not visualized. No aortic stenosis is present. Pulmonic Valve: The pulmonic valve was not well visualized. Pulmonic valve regurgitation is not visualized. No evidence of pulmonic stenosis. Aorta: The aortic root is normal in size and structure. Venous: The inferior vena cava is normal in size with greater than 50% respiratory variability, suggesting right atrial pressure of 3 mmHg. IAS/Shunts: No atrial level shunt detected by color flow Doppler.  LEFT VENTRICLE PLAX 2D LVIDd:         4.20 cm LVIDs:         3.40 cm LV PW:         1.60 cm LV IVS:        1.10 cm LVOT diam:      2.00 cm LVOT Area:     3.14 cm  LEFT ATRIUM         Index LA diam:    3.60 cm 1.20 cm/m   AORTA Ao Root diam: 2.90 cm  SHUNTS Systemic Diam: 2.00 cm Ida Rogue MD Electronically signed by Christia Reading  Gollan MD Signature Date/Time: 07/25/2022/2:26:05 PM    Final    DG Chest Port 1 View  Result Date: 07/25/2022 CLINICAL DATA:  Shortness of breath. EXAM: PORTABLE CHEST 1 VIEW COMPARISON:  July 14, 2022 FINDINGS: The cardiac silhouette is mildly enlarged and unchanged in size. Low lung volumes are noted with subsequent crowding of the bronchovascular lung markings. Mild suprahilar and infrahilar prominence of the pulmonary vasculature is seen. There is no evidence of focal consolidation, pleural effusion or pneumothorax. The visualized skeletal structures are unremarkable. IMPRESSION: Low lung volumes with mild pulmonary vascular congestion. Electronically Signed   By: Virgina Norfolk M.D.   On: 07/25/2022 00:04   LONG TERM MONITOR (3-14 DAYS)  Result Date: 07/15/2022 Patch Wear Time:  14 days and 0 hours (2023-08-24T17:20:49-0400 to 2023-09-07T17:20:53-0400) Patient had a min HR of 51 bpm, max HR of 190 bpm, and avg HR of 93 bpm. Predominant underlying rhythm was Sinus Rhythm. 1 run of Ventricular Tachycardia occurred lasting 9.6 secs with a max rate of 190 bpm (avg 144 bpm). Isolated SVEs were rare (<1.0%),  and no SVE Couplets or SVE Triplets were present. Isolated VEs were rare (<1.0%), VE Couplets were rare (<1.0%), and no VE Triplets were present. Conclusion 1 episode of nonsustained VT lasting 9 seconds noted. No other significant arrhythmias.   DG Chest Port 1 View  Result Date: 07/14/2022 CLINICAL DATA:  Dyspnea EXAM: PORTABLE CHEST 1 VIEW COMPARISON:  06/08/2022 FINDINGS: Lung volumes are small, but are symmetric and are clear. No pneumothorax or pleural effusion. Cardiac size is within normal limits when accounting for poor pulmonary insufflation. Pulmonary vascularity is normal. No acute  bone abnormality. IMPRESSION: Pulmonary hypoinflation. Electronically Signed   By: Fidela Salisbury M.D.   On: 07/14/2022 00:51    Microbiology: Results for orders placed or performed during the hospital encounter of 07/24/22  Resp Panel by RT-PCR (Flu A&B, Covid) Anterior Nasal Swab     Status: None   Collection Time: 07/25/22 12:07 AM   Specimen: Anterior Nasal Swab  Result Value Ref Range Status   SARS Coronavirus 2 by RT PCR NEGATIVE NEGATIVE Final    Comment: (NOTE) SARS-CoV-2 target nucleic acids are NOT DETECTED.  The SARS-CoV-2 RNA is generally detectable in upper respiratory specimens during the acute phase of infection. The lowest concentration of SARS-CoV-2 viral copies this assay can detect is 138 copies/mL. A negative result does not preclude SARS-Cov-2 infection and should not be used as the sole basis for treatment or other patient management decisions. A negative result may occur with  improper specimen collection/handling, submission of specimen other than nasopharyngeal swab, presence of viral mutation(s) within the areas targeted by this assay, and inadequate number of viral copies(<138 copies/mL). A negative result must be combined with clinical observations, patient history, and epidemiological information. The expected result is Negative.  Fact Sheet for Patients:  EntrepreneurPulse.com.au  Fact Sheet for Healthcare Providers:  IncredibleEmployment.be  This test is no t yet approved or cleared by the [place] FDA and  has been authorized for detection and/or diagnosis of SARS-CoV-2 by FDA under an Emergency Use Authorization (EUA). This EUA will remain  in effect (meaning this test can be used) for the duration of the COVID-19 declaration under Section 564(b)(1) of the Act, 21 U.S.C.section 360bbb-3(b)(1), unless the authorization is terminated  or revoked sooner.       Influenza A by PCR NEGATIVE NEGATIVE Final    Influenza B by PCR NEGATIVE NEGATIVE Final    Comment: (  NOTE) The Xpert Xpress SARS-CoV-2/FLU/RSV plus assay is intended as an aid in the diagnosis of influenza from Nasopharyngeal swab specimens and should not be used as a sole basis for treatment. Nasal washings and aspirates are unacceptable for Xpert Xpress SARS-CoV-2/FLU/RSV testing.  Fact Sheet for Patients: EntrepreneurPulse.com.au  Fact Sheet for Healthcare Providers: IncredibleEmployment.be  This test is not yet approved or cleared by the [place] FDA and has been authorized for detection and/or diagnosis of SARS-CoV-2 by FDA under an Emergency Use Authorization (EUA). This EUA will remain in effect (meaning this test can be used) for the duration of the COVID-19 declaration under Section 564(b)(1) of the Act, 21 U.S.C. section 360bbb-3(b)(1), unless the authorization is terminated or revoked.  Performed at [facility], [address] [place], [place] [address]     Labs: CBC: Recent Labs  Lab 07/25/22 0008  WBC 9.5  NEUTROABS 6.9  HGB 9.1*  HCT 31.6*  MCV 84.7  PLT 725   Basic Metabolic Panel: Recent Labs  Lab 07/25/22 0008  NA 140  K 3.8  CL 111  CO2 23  GLUCOSE 114*  BUN 10  CREATININE 0.68  CALCIUM 8.8*   Liver Function Tests: Recent Labs  Lab 07/25/22 0008  AST 17  ALT 20  ALKPHOS 47  BILITOT 0.4  PROT 7.3  ALBUMIN 3.1*   CBG: Recent Labs  Lab 07/25/22 0828  GLUCAP 175*    Discharge time spent: greater than 30 minutes.  .sad  Signed: Lorella Nimrod, MD [facility] 07/25/2022

## 2022-07-25 NOTE — ED Notes (Signed)
Pt asking about dispo today- md messaged via secure chat.

## 2022-07-25 NOTE — Hospital Course (Addendum)
Taken from H&P.  Sabrina Bell is a 21 y.o. female with medical history significant for Obesity with BMI over 70, GAD, IDA, asthma, OSA, recently evaluated for dizziness with 14-day monitor showing a 9 beat asymptomatic run of V. tach for which no treatment was indicated who presents to the ED with shortness of breath, wheezing associated with chest pressure.  She denied cough, fever or chills.  EMS administered aspirin 324 mg in route. ED course and data review: Vitals within normal limits.  VBG unremarkable, troponin less than 2.  CMP and CBC at baseline, lipase and LFTs WNL EKG, personally reviewed and interpreted showing sinus at 96 with no acute ST-T wave changes.  Chest x-ray showing mild pulmonary vascular congestion. Patient treated with DuoNebs, methylprednisolone and IV Lasix.   Patient remained stable.  Saturating well on room air.  She is morbidly obese with BMI close to 80 which is most likely contributory to her feeling of shortness of breath.  No wheezing today when seen during morning rounds. A1c of 5.8 which makes her prediabetic. Cardiology was also consulted from ED for her complaint of chest tightness, troponin remain negative and no acute changes on EKG. Echocardiogram without any significant abnormality.  Patient need to be seen at a specialized weight reduction clinic to help reducing the weight.  Not sure whether she has underlying congenital disorder for being that overweight at this age.  Patient is being discharged on 3 more days of prednisone.  She was instructed to watch her diet as it can raise her blood glucose level. She will also instructed to follow-up at a weight reduction clinic.  PCP should be able to help.  Based on her morbid obesity she will be high risk for multiple other comorbidities which can limit her lifespan.

## 2022-07-25 NOTE — Assessment & Plan Note (Signed)
Monitor blood sugar in the setting of steroid treatment

## 2022-07-25 NOTE — ED Notes (Signed)
Assumed care for pt. Pt verbalized improvement of SOB. RR Tachypnea, without wheezing. Denies pain. VS update. Pt aware admission plan.

## 2022-07-25 NOTE — ED Notes (Signed)
Pt asking for purewick to be removed, purewick taken off. Pt educated about callbell and calling for next toileting needs, pt denies any other needs at this time.

## 2022-07-25 NOTE — ED Notes (Signed)
This RN now assuming care of pt after receiving report.

## 2022-07-25 NOTE — Assessment & Plan Note (Signed)
Patient reports chest pressure.  EKG nonacute and troponin less than 2 Likely noncardiac Given recent complaints of dizziness, mild congestion on chest x-ray, recent NSVT on Zio patch, will get echocardiogram and consult cardiology We will get baseline BNP

## 2022-07-26 DIAGNOSIS — R0602 Shortness of breath: Secondary | ICD-10-CM | POA: Diagnosis not present

## 2022-07-26 DIAGNOSIS — Z7951 Long term (current) use of inhaled steroids: Secondary | ICD-10-CM | POA: Diagnosis not present

## 2022-07-26 DIAGNOSIS — R0989 Other specified symptoms and signs involving the circulatory and respiratory systems: Secondary | ICD-10-CM | POA: Diagnosis not present

## 2022-07-26 DIAGNOSIS — R0789 Other chest pain: Secondary | ICD-10-CM | POA: Diagnosis not present

## 2022-07-26 DIAGNOSIS — R7303 Prediabetes: Secondary | ICD-10-CM | POA: Diagnosis not present

## 2022-07-26 DIAGNOSIS — Z7984 Long term (current) use of oral hypoglycemic drugs: Secondary | ICD-10-CM | POA: Diagnosis not present

## 2022-07-26 DIAGNOSIS — R079 Chest pain, unspecified: Secondary | ICD-10-CM | POA: Diagnosis not present

## 2022-07-26 DIAGNOSIS — I498 Other specified cardiac arrhythmias: Secondary | ICD-10-CM | POA: Diagnosis not present

## 2022-07-26 DIAGNOSIS — F332 Major depressive disorder, recurrent severe without psychotic features: Secondary | ICD-10-CM | POA: Diagnosis not present

## 2022-07-27 DIAGNOSIS — R079 Chest pain, unspecified: Secondary | ICD-10-CM | POA: Diagnosis not present

## 2022-08-12 ENCOUNTER — Other Ambulatory Visit: Payer: BC Managed Care – PPO

## 2022-08-26 ENCOUNTER — Telehealth: Payer: Self-pay | Admitting: Obstetrics and Gynecology

## 2022-08-26 NOTE — Telephone Encounter (Signed)
I contacted patient via phone. Voicemail is full unable to leave message. Patient to rescheduled missed appointment for ultrasound on 08/12/22

## 2022-08-30 NOTE — Progress Notes (Signed)
Cardiology Office Note    Date:  09/03/2022   ID:  Sabrina [place], DOB 02-16-2001, MRN [id number]  PCP:  Pcp, No  Cardiologist:  Debbe Odea, MD  Electrophysiologist:  None   Chief Complaint: Follow-up  History of Present Illness:   Sabrina Bell is a 21 y.o. female with history of NSVT, asthma, anemia, morbid obesity with a BMI of 79, OSA with likely OHS and anxiety who presents for follow-up of Zio patch and echo.  Pediatric echo in 01/2017 showed no significant cardiac disease identified with poor acoustic windows.  Over the years, there have been frequent ED visits for varying ailments.  Please see Epic for details.  She established care with our office in 05/2022 for evaluation of dizziness without frank syncope after ED visit with plan cardiac work-up.  Orthostatics were normal.  Zio patch in 05/2022 showed a predominant rhythm of sinus with an average rate of 93 bpm (range 51 to 190 bpm), 1 run of NSVT lasting 9.6 seconds with a maximal rate of 190 bpm, and rare PACs/PVCs.  Echo was recommended, though prior to completion, she was admitted to the hospital in 07/2022 with atypical chest pain and ruled out.  BNP normal.  EKG nonacute.  She was treated for asthma exacerbation and given 1 dose of Lasix with symptomatic improvement.  Echo during the admission showed an EF of 60 to 65%, no regional wall motion abnormalities, indeterminate LV diastolic function parameters, normal RV systolic function and ventricular cavity size, no significant valvular abnormalities, and an estimated right atrial pressure of 3 mmHg.  She was later seen at an outside ED on 07/27/2022 with chest discomfort.  High-sensitivity troponin normal.  BNP normal.  EKG documented as a sinus arrhythmia without evidence of ischemia.  Presentation was felt to not be consistent with cardiac etiology with recommendation to follow-up as outpatient.  She comes in doing reasonably well from a cardiac perspective.  No  symptoms of decompensation.  Her asthma is under better control, only needing her albuterol inhaler 4-5 times per month.  She reports she has been without her CPAP for quite some time and does not currently have a provider to manage her sleep apnea and likely OHS.  No palpitations, dizziness, presyncope, or syncope.  Overall, she does feel like she is doing better when compared to her recent ED visits.   Labs independently reviewed: 07/2022 - Hgb 9.5, PLT 293, potassium 3.6, BUN 13, serum creatinine 0.61, albumin 3.6, AST/ALT normal, A1c 5.8 06/2022 - magnesium 1.8 09/2020 - TSH normal 10/2018 - TC 155, TG 47, HDL 54, LDL 92  Past Medical History:  Diagnosis Date   Asthma    Borderline diabetes    Depression    Diabetes mellitus without complication (HCC)    Migraines    Obesity     History reviewed. No pertinent surgical history.  Current Medications: Current Meds  Medication Sig   albuterol (VENTOLIN HFA) 108 (90 Base) MCG/ACT inhaler Inhale 2 puffs into the lungs every 4 (four) hours as needed for wheezing or shortness of breath.   EPINEPHrine 0.3 mg/0.3 mL IJ SOAJ injection Inject 0.3 mLs (0.3 mg total) into the muscle as needed for anaphylaxis.   ibuprofen (ADVIL) 200 MG tablet Take 200 mg by mouth every 6 (six) hours as needed for headache.   medroxyPROGESTERone (PROVERA) 10 MG tablet Take 1 tablet (10 mg total) by mouth daily.   Spacer/Aero-Holding Chambers (AEROCHAMBER PLUS) inhaler Use as instructed  Allergies:   Cashew nut oil, Citrullus vulgaris, Omnicef [cefdinir], Other, and Watermelon flavor [flavoring agent]   Social History   Socioeconomic History   Marital status: Single    Spouse name: Not on file   Number of children: Not on file   Years of education: Not on file   Highest education level: Not on file  Occupational History   Not on file  Tobacco Use   Smoking status: Never   Smokeless tobacco: Never  Vaping Use   Vaping Use: Never used  Substance and  Sexual Activity   Alcohol use: No   Drug use: No   Sexual activity: Never    Birth control/protection: Other-see comments    Comment: Provera  Other Topics Concern   Not on file  Social History Narrative   Not on file   Social Determinants of Health   Financial Resource Strain: Not on file  Food Insecurity: Not on file  Transportation Needs: Not on file  Physical Activity: Not on file  Stress: Not on file  Social Connections: Not on file     Family History:  The patient's family history includes Diabetes in her mother; Ovarian cancer in her paternal grandmother; Prostate cancer in her paternal grandfather; Vision loss in her mother.  ROS:   12-point review of systems is negative unless otherwise noted in the HPI   EKGs/Labs/Other Studies Reviewed:    Studies reviewed were summarized above. The additional studies were reviewed today:  2D echo 07/25/2022: 1. Left ventricular ejection fraction, by estimation, is 60 to 65%. The  left ventricle has normal function. The left ventricle has no regional  wall motion abnormalities. Left ventricular diastolic parameters are  indeterminate.   2. Right ventricular systolic function is grossly normal. The right  ventricular size is normal. Tricuspid regurgitation signal is inadequate  for assessing PA pressure.   3. The mitral valve was not well visualized. No evidence of mitral valve  regurgitation. No evidence of mitral stenosis.   4. The aortic valve was not well visualized. Aortic valve regurgitation  is not visualized. No aortic stenosis is present.   5. The inferior vena cava is normal in size with greater than 50%  respiratory variability, suggesting right atrial pressure of 3 mmHg.  __________  Luci [facility] patch 05/2022: Patient had a min HR of 51 bpm, max HR of 190 bpm, and avg HR of 93 bpm. Predominant underlying rhythm was Sinus Rhythm. 1 run of Ventricular Tachycardia occurred lasting 9.6 secs with a max rate of 190 bpm (avg 144  bpm). Isolated SVEs were rare (<1.0%),  and no SVE Couplets or SVE Triplets were present. Isolated VEs were rare (<1.0%), VE Couplets were rare (<1.0%), and no VE Triplets were present.   Conclusion 1 episode of nonsustained VT lasting 9 seconds noted. No other significant arrhythmias. __________  Pediatric echo 02/17/2017: - INTERPRETATION SUMMARY    Technically difficult study due to poor acoustic windows.    No cardiac disease identified.    EKG:  EKG is not ordered today.    Recent Labs: 07/14/2022: Magnesium 1.8 07/25/2022: ALT 20; B Natriuretic Peptide 60.9; BUN 10; Creatinine, Ser 0.68; Hemoglobin 9.1; Platelets 261; Potassium 3.8; Sodium 140  Recent Lipid Panel    Component Value Date/Time   CHOL 155 11/05/2018 1112   TRIG 47 11/05/2018 1112   HDL 54 11/05/2018 1112   CHOLHDL 2.9 11/05/2018 1112   VLDL 9 11/05/2018 1112   LDLCALC 92 11/05/2018 1112    PHYSICAL  EXAM:    VS:  BP 132/86 (BP Location: Left Arm, Patient Position: Sitting, Cuff Size: Normal)   Pulse 68   Ht 5\' 7"  (1.702 m)   Wt (!) 510 lb 6.4 oz (231.5 kg)   SpO2 93%   BMI 79.94 kg/m   BMI: Body mass index is 79.94 kg/m.  Physical Exam Vitals reviewed.  Constitutional:      Appearance: She is well-developed.  HENT:     Head: Normocephalic and atraumatic.  Eyes:     General:        Right eye: No discharge.        Left eye: No discharge.  Neck:     Comments: JVD unable to be assessed secondary to body habitus Cardiovascular:     Rate and Rhythm: Normal rate and regular rhythm.     Heart sounds: Normal heart sounds, S1 normal and S2 normal. Heart sounds not distant. No midsystolic click and no opening snap. No murmur heard.    No friction rub.     Comments: Distant heart sounds Pulmonary:     Effort: Pulmonary effort is normal. No respiratory distress.     Breath sounds: Normal breath sounds. No decreased breath sounds, wheezing or rales.  Chest:     Chest wall: No tenderness.  Abdominal:      General: There is no distension.  Musculoskeletal:     Cervical back: Normal range of motion.  Skin:    General: Skin is warm and dry.     Nails: There is no clubbing.  Neurological:     Mental Status: She is alert and oriented to person, place, and time.  Psychiatric:        Speech: Speech normal.        Behavior: Behavior normal.        Thought Content: Thought content normal.        Judgment: Judgment normal.     Wt Readings from Last 3 Encounters:  09/03/22 (!) 510 lb 6.4 oz (231.5 kg)  07/24/22 (!) 510 lb (231.3 kg)  07/14/22 (!) 511 lb (231.8 kg)     ASSESSMENT & PLAN:   NSVT: Asymptomatic.  Likely in the setting of obesity with untreated OSA and OHS.  Preserved LV systolic function on echo.  No indication for medical therapy at this time.  Anemia: Possibly contributing to symptoms.  Follow-up with PCP.  Asthma: Stable.  Follow-up with PCP.  Morbid obesity: Weight loss is encouraged.  May benefit from addition of GLP-1 agonist, will defer to PCP.  Refer to healthy weight and wellness center.  OSA with likely pickwickian syndrome: Refer to sleep medicine.   Disposition: F/u with Dr. Azucena Cecil in 12 months.   Medication Adjustments/Labs and Tests Ordered: Current medicines are reviewed at length with the patient today.  Concerns regarding medicines are outlined above. Medication changes, Labs and Tests ordered today are summarized above and listed in the Patient Instructions accessible in Encounters.   Signed, Eula Listen, PA-C 09/03/2022 4:07 PM     [facility] [address] [place], [place] [address] [phone]

## 2022-09-03 ENCOUNTER — Ambulatory Visit: Payer: BC Managed Care – PPO | Attending: Physician Assistant | Admitting: Physician Assistant

## 2022-09-03 ENCOUNTER — Encounter (INDEPENDENT_AMBULATORY_CARE_PROVIDER_SITE_OTHER): Payer: Self-pay

## 2022-09-03 ENCOUNTER — Encounter: Payer: Self-pay | Admitting: Physician Assistant

## 2022-09-03 VITALS — BP 132/86 | HR 68 | Ht 67.0 in | Wt >= 6400 oz

## 2022-09-03 DIAGNOSIS — E662 Morbid (severe) obesity with alveolar hypoventilation: Secondary | ICD-10-CM

## 2022-09-03 DIAGNOSIS — J45909 Unspecified asthma, uncomplicated: Secondary | ICD-10-CM

## 2022-09-03 DIAGNOSIS — G4733 Obstructive sleep apnea (adult) (pediatric): Secondary | ICD-10-CM | POA: Diagnosis not present

## 2022-09-03 DIAGNOSIS — I4729 Other ventricular tachycardia: Secondary | ICD-10-CM

## 2022-09-03 DIAGNOSIS — Z6841 Body Mass Index (BMI) 40.0 and over, adult: Secondary | ICD-10-CM

## 2022-09-03 DIAGNOSIS — D649 Anemia, unspecified: Secondary | ICD-10-CM

## 2022-09-03 NOTE — Patient Instructions (Addendum)
Referrals have been placed for Pulmonary & [facility] [place].   Medication Instructions:  No changes at this time.   *If you need a refill on your cardiac medications before your next appointment, please call your pharmacy*   Lab Work: None  If you have labs (blood work) drawn today and your tests are completely normal, you will receive your results only by: [place] (if you have MyChart) OR A paper copy in the mail If you have any lab test that is abnormal or we need to change your treatment, we will call you to review the results.   Testing/Procedures: None   Follow-Up: At [facility], you and your health needs are our priority.  As part of our continuing mission to provide you with exceptional heart care, we have created designated Provider Care Teams.  These Care Teams include your primary Cardiologist (physician) and Advanced Practice Providers (APPs -  Physician Assistants and Nurse Practitioners) who all work together to provide you with the care you need, when you need it.   Your next appointment:   1 year(s)  The format for your next appointment:   In Person  Provider:   Kate Sable, MD        Important Information About [place]

## 2022-09-23 ENCOUNTER — Encounter (INDEPENDENT_AMBULATORY_CARE_PROVIDER_SITE_OTHER): Payer: BC Managed Care – PPO | Admitting: Family Medicine

## 2022-09-26 ENCOUNTER — Encounter: Payer: Self-pay | Admitting: Emergency Medicine

## 2022-09-26 ENCOUNTER — Other Ambulatory Visit: Payer: Self-pay

## 2022-09-26 ENCOUNTER — Emergency Department
Admission: EM | Admit: 2022-09-26 | Discharge: 2022-09-26 | Disposition: A | Discharge status: Home / Self Care | Payer: BC Managed Care – PPO | Attending: Emergency Medicine | Admitting: Emergency Medicine

## 2022-09-26 DIAGNOSIS — H66003 Acute suppurative otitis media without spontaneous rupture of ear drum, bilateral: Secondary | ICD-10-CM | POA: Insufficient documentation

## 2022-09-26 DIAGNOSIS — Z1152 Encounter for screening for COVID-19: Secondary | ICD-10-CM | POA: Diagnosis not present

## 2022-09-26 DIAGNOSIS — H9203 Otalgia, bilateral: Secondary | ICD-10-CM | POA: Diagnosis not present

## 2022-09-26 LAB — RESP PANEL BY RT-PCR (FLU A&B, COVID) ARPGX2
Influenza A by PCR: NEGATIVE
Influenza B by PCR: NEGATIVE
SARS Coronavirus 2 by RT PCR: NEGATIVE

## 2022-09-26 LAB — GROUP A STREP BY PCR: Group A Strep by PCR: NOT DETECTED

## 2022-09-26 MED ORDER — AZITHROMYCIN 500 MG PO TABS
500.0000 mg | ORAL_TABLET | Freq: Every day | ORAL | Status: DC
  Administered 2022-09-26: 500 mg via ORAL
  Filled 2022-09-26: qty 1

## 2022-09-26 MED ORDER — AZITHROMYCIN 250 MG PO TABS: 250.0000 mg | ORAL_TABLET | Freq: Every day | ORAL | 0 refills | Status: AC

## 2022-09-26 NOTE — ED Provider Notes (Signed)
[facility] Emergency Department Provider Note     Event Date/Time   First MD Initiated Contact with Patient 09/26/22 1910     (approximate)   History   Sore Throat   HPI  Sabrina Bell is a 21 y.o. female presents to the ED for evaluation of sore throat, ear pain and leg for the last few days.  Patient reports pain when she swallows as well as excruciating ear pain bilaterally.  She denies any associated fevers, chills, or sweats.     Physical Exam   Triage Vital Signs: ED Triage Vitals  Enc Vitals Group     BP 09/26/22 1902 (!) 119/58     Pulse Rate 09/26/22 1902 97     Resp 09/26/22 1902 20     Temp 09/26/22 1903 98.3 F (36.8 C)     Temp Source 09/26/22 1903 Oral     SpO2 09/26/22 1902 94 %     Weight 09/26/22 1901 (!) 510 lb (231.3 kg)     Height 09/26/22 1901 '5\' 7"'$  (1.702 m)     Head Circumference --      Peak Flow --      Pain Score 09/26/22 1901 9     Pain Loc --      Pain Edu? --      Excl. in [place]? --     Most recent vital signs: Vitals:   09/26/22 1902 09/26/22 1903  BP: (!) 119/58   Pulse: 97   Resp: 20   Temp:  98.3 F (36.8 C)  SpO2: 94%     General Awake, no distress. NAD HEENT NCAT. PERRL. EOMI. No rhinorrhea. Mucous membranes are moist.  TMs are intact bilaterally.  They are erythematous, bulging, and purulent effusions are noted bilaterally.  Below is midline and tonsils are mildly erythematous without exudates noted. CV:  Good peripheral perfusion.  RESP:  Normal effort.  ABD:  No distention.    ED Results / Procedures / Treatments   Labs (all labs ordered are listed, but only abnormal results are displayed) Labs Reviewed  GROUP A STREP BY PCR  RESP PANEL BY RT-PCR (FLU A&B, COVID) ARPGX2   EKG    RADIOLOGY  No results found.   PROCEDURES:  Critical Care performed: No  Procedures   MEDICATIONS ORDERED IN ED: Medications  azithromycin (ZITHROMAX) tablet 500 mg (has no administration in  time range)     IMPRESSION / MDM / ASSESSMENT AND PLAN / ED COURSE  I reviewed the triage vital signs and the nursing notes.                              Differential diagnosis includes, but is not limited to, AOM, covid, flu, viral URI, strep throat  Patient's presentation is most consistent with acute complicated illness / injury requiring diagnostic workup.  Patient's diagnosis is consistent with bilateral AOM. Patient will be discharged home with prescriptions for Azithromycin. Patient is to follow up with her PCP as needed or otherwise directed. Patient is given ED precautions to return to the ED for any worsening or new symptoms.     FINAL CLINICAL IMPRESSION(S) / ED DIAGNOSES   Final diagnoses:  Non-recurrent acute suppurative otitis media of both ears without spontaneous rupture of tympanic membranes     Rx / DC Orders   ED Discharge Orders          Ordered  azithromycin (ZITHROMAX Z-PAK) 250 MG tablet  Daily        09/26/22 1918             Note:  This document was prepared using Dragon voice recognition software and may include unintentional dictation errors.    Melvenia Needles, PA-C 09/26/22 1918    Naaman Plummer, MD 09/27/22 1056

## 2022-09-26 NOTE — ED Triage Notes (Signed)
Patient arrives in wheelchair by POV c/o sore throat and painful to swallow for a couple days. Patient also reports ears feeling full.

## 2022-09-26 NOTE — Discharge Instructions (Addendum)
Take antibiotic as directed until the remaining 4 pills are gone.  Continue with OTC cough and cold medicine.  You may take OTC pseudoephedrine for ear pressure.  You should also take an over-the-counter allergy medicine or sinus drainage.

## 2022-10-06 ENCOUNTER — Other Ambulatory Visit: Payer: Self-pay | Admitting: Obstetrics and Gynecology

## 2022-10-06 DIAGNOSIS — N939 Abnormal uterine and vaginal bleeding, unspecified: Secondary | ICD-10-CM

## 2022-10-12 ENCOUNTER — Other Ambulatory Visit: Payer: Self-pay

## 2022-10-12 ENCOUNTER — Emergency Department
Admission: EM | Admit: 2022-10-12 | Discharge: 2022-10-12 | Disposition: A | Discharge status: Home / Self Care | Payer: BC Managed Care – PPO | Source: Home / Self Care | Attending: Emergency Medicine | Admitting: Emergency Medicine

## 2022-10-12 ENCOUNTER — Emergency Department: Payer: BC Managed Care – PPO

## 2022-10-12 DIAGNOSIS — R0689 Other abnormalities of breathing: Secondary | ICD-10-CM | POA: Diagnosis not present

## 2022-10-12 DIAGNOSIS — J4521 Mild intermittent asthma with (acute) exacerbation: Secondary | ICD-10-CM | POA: Diagnosis not present

## 2022-10-12 DIAGNOSIS — R0902 Hypoxemia: Secondary | ICD-10-CM | POA: Diagnosis not present

## 2022-10-12 DIAGNOSIS — Z23 Encounter for immunization: Secondary | ICD-10-CM | POA: Diagnosis not present

## 2022-10-12 DIAGNOSIS — Z91018 Allergy to other foods: Secondary | ICD-10-CM | POA: Diagnosis not present

## 2022-10-12 DIAGNOSIS — J4541 Moderate persistent asthma with (acute) exacerbation: Secondary | ICD-10-CM | POA: Diagnosis not present

## 2022-10-12 DIAGNOSIS — Z6841 Body Mass Index (BMI) 40.0 and over, adult: Secondary | ICD-10-CM | POA: Diagnosis not present

## 2022-10-12 DIAGNOSIS — D509 Iron deficiency anemia, unspecified: Secondary | ICD-10-CM | POA: Diagnosis not present

## 2022-10-12 DIAGNOSIS — B974 Respiratory syncytial virus as the cause of diseases classified elsewhere: Secondary | ICD-10-CM | POA: Diagnosis not present

## 2022-10-12 DIAGNOSIS — B338 Other specified viral diseases: Secondary | ICD-10-CM

## 2022-10-12 DIAGNOSIS — J811 Chronic pulmonary edema: Secondary | ICD-10-CM | POA: Diagnosis not present

## 2022-10-12 DIAGNOSIS — Z8041 Family history of malignant neoplasm of ovary: Secondary | ICD-10-CM | POA: Diagnosis not present

## 2022-10-12 DIAGNOSIS — R059 Cough, unspecified: Secondary | ICD-10-CM | POA: Diagnosis not present

## 2022-10-12 DIAGNOSIS — Z888 Allergy status to other drugs, medicaments and biological substances status: Secondary | ICD-10-CM | POA: Diagnosis not present

## 2022-10-12 DIAGNOSIS — R062 Wheezing: Secondary | ICD-10-CM | POA: Diagnosis not present

## 2022-10-12 DIAGNOSIS — Z821 Family history of blindness and visual loss: Secondary | ICD-10-CM | POA: Diagnosis not present

## 2022-10-12 DIAGNOSIS — Z9102 Food additives allergy status: Secondary | ICD-10-CM | POA: Diagnosis not present

## 2022-10-12 DIAGNOSIS — Z833 Family history of diabetes mellitus: Secondary | ICD-10-CM | POA: Diagnosis not present

## 2022-10-12 DIAGNOSIS — R0602 Shortness of breath: Secondary | ICD-10-CM | POA: Diagnosis not present

## 2022-10-12 DIAGNOSIS — Z20822 Contact with and (suspected) exposure to covid-19: Secondary | ICD-10-CM | POA: Insufficient documentation

## 2022-10-12 DIAGNOSIS — J45901 Unspecified asthma with (acute) exacerbation: Secondary | ICD-10-CM | POA: Insufficient documentation

## 2022-10-12 DIAGNOSIS — Z881 Allergy status to other antibiotic agents status: Secondary | ICD-10-CM | POA: Diagnosis not present

## 2022-10-12 DIAGNOSIS — R0600 Dyspnea, unspecified: Secondary | ICD-10-CM | POA: Diagnosis not present

## 2022-10-12 DIAGNOSIS — R7303 Prediabetes: Secondary | ICD-10-CM | POA: Diagnosis not present

## 2022-10-12 DIAGNOSIS — E662 Morbid (severe) obesity with alveolar hypoventilation: Secondary | ICD-10-CM | POA: Diagnosis not present

## 2022-10-12 DIAGNOSIS — J121 Respiratory syncytial virus pneumonia: Secondary | ICD-10-CM | POA: Diagnosis not present

## 2022-10-12 DIAGNOSIS — Z79899 Other long term (current) drug therapy: Secondary | ICD-10-CM | POA: Diagnosis not present

## 2022-10-12 DIAGNOSIS — J80 Acute respiratory distress syndrome: Secondary | ICD-10-CM | POA: Diagnosis not present

## 2022-10-12 DIAGNOSIS — J9601 Acute respiratory failure with hypoxia: Secondary | ICD-10-CM | POA: Diagnosis not present

## 2022-10-12 DIAGNOSIS — Z1152 Encounter for screening for COVID-19: Secondary | ICD-10-CM | POA: Diagnosis not present

## 2022-10-12 DIAGNOSIS — J189 Pneumonia, unspecified organism: Secondary | ICD-10-CM | POA: Diagnosis not present

## 2022-10-12 DIAGNOSIS — R000 Tachycardia, unspecified: Secondary | ICD-10-CM | POA: Diagnosis not present

## 2022-10-12 DIAGNOSIS — G4733 Obstructive sleep apnea (adult) (pediatric): Secondary | ICD-10-CM | POA: Diagnosis not present

## 2022-10-12 DIAGNOSIS — D500 Iron deficiency anemia secondary to blood loss (chronic): Secondary | ICD-10-CM | POA: Diagnosis not present

## 2022-10-12 LAB — RESP PANEL BY RT-PCR (RSV, FLU A&B, COVID)  RVPGX2
Influenza A by PCR: NEGATIVE
Influenza B by PCR: NEGATIVE
Resp Syncytial Virus by PCR: POSITIVE — AB
SARS Coronavirus 2 by RT PCR: NEGATIVE

## 2022-10-12 MED ORDER — IPRATROPIUM-ALBUTEROL 0.5-2.5 (3) MG/3ML IN SOLN
3.0000 mL | Freq: Once | RESPIRATORY_TRACT | Status: AC
  Administered 2022-10-12: 3 mL via RESPIRATORY_TRACT
  Filled 2022-10-12: qty 3

## 2022-10-12 MED ORDER — PREDNISONE 20 MG PO TABS: 40.0000 mg | ORAL_TABLET | Freq: Every day | ORAL | 0 refills | Status: DC

## 2022-10-12 MED ORDER — METHYLPREDNISOLONE SODIUM SUCC 125 MG IJ SOLR
125.0000 mg | Freq: Once | INTRAMUSCULAR | Status: AC
  Administered 2022-10-12: 125 mg via INTRAVENOUS
  Filled 2022-10-12: qty 2

## 2022-10-12 NOTE — ED Triage Notes (Signed)
Pt brought in via ems from home due to shob. Pt states she has a new cough x 2 days and has a hx of asthma. Pt shob un-relieved by home neb treatments. Pt 92% on room air. Pt wheezing on arrival. Pt given 1 duo neb pta.

## 2022-10-12 NOTE — Discharge Instructions (Signed)
Please seek medical attention for any high fevers, chest pain, shortness of breath, change in behavior, persistent vomiting, bloody stool or any other new or concerning symptoms.   Please go to the following website to schedule new (and existing) patient appointments:   [email or web address]   The following is a list of primary care offices in the area who are accepting new patients at this time.  Please reach out to one of them directly and let them know you would like to schedule an appointment to follow up on an Emergency Department visit, and/or to establish a new primary care provider (PCP).  There are likely other primary care clinics in the are who are accepting new patients, but this is an excellent place to start:  [place] physician: Dr Lavon Paganini [address] [place], [place] [address] [phone]  [facility] Lead Physician: Dr Steele Sizer [address], [place], [place] [address] [phone]  [place] Physician: Dr Park Liter [address] [place], [place] [address] [phone]  [facility] Lead Physician: Dr Dewaine Oats [address] [place], [place] [address] [phone]  [facility] Physician: Dr Halina Maidens [address] [place], [place], [place] [address] [phone]

## 2022-10-12 NOTE — ED Provider Notes (Signed)
[facility] Provider Note    Event Date/Time   First MD Initiated Contact with Patient 10/12/22 1659     (approximate)   History   Shortness of Breath   HPI  Sabrina Bell is a 21 y.o. female   who presents to the emergency department today because of concerns for shortness of breath.  Patient has a history of asthma.  Symptoms started 2 days ago.  They have been accompanied by nonproductive cough.  She has been using her home breathing treatments with very minimal relief.  Patient denies any fevers.  States she has had a little chest tightness and discomfort.  Denies any known sick contacts.   Physical Exam   Triage Vital Signs: ED Triage Vitals  Enc Vitals Group     BP 10/12/22 1706 (!) 141/94     Pulse Rate 10/12/22 1706 (!) 111     Resp 10/12/22 1706 (!) 24     Temp 10/12/22 1706 99.1 F (37.3 C)     Temp Source 10/12/22 1706 Oral     SpO2 10/12/22 1706 99 %     Weight 10/12/22 1707 (!) 527 lb 9 oz (239.3 kg)     Height 10/12/22 1707 '5\' 7"'$  (1.702 m)     Head Circumference --      Peak Flow --      Pain Score 10/12/22 1706 0   Most recent vital signs: Vitals:   10/12/22 1706  BP: (!) 141/94  Pulse: (!) 111  Resp: (!) 24  Temp: 99.1 F (37.3 C)  SpO2: 99%   General: Awake, alert, oriented. CV:  Good peripheral perfusion. Tachycardia. Resp:  Slightly increased respiratory effort. Poor air movement diffusely. Abd:  No distention.     ED Results / Procedures / Treatments   Labs (all labs ordered are listed, but only abnormal results are displayed) Labs Reviewed  RESP PANEL BY RT-PCR (RSV, FLU A&B, COVID)  RVPGX2 - Abnormal; Notable for the following components:      Result Value   Resp Syncytial Virus by PCR POSITIVE (*)    All other components within normal limits  CULTURE, BLOOD (ROUTINE X 2)  CULTURE, BLOOD (ROUTINE X 2)  LACTIC ACID, PLASMA  LACTIC ACID, PLASMA  COMPREHENSIVE METABOLIC PANEL  CBC WITH  DIFFERENTIAL/PLATELET  POC URINE PREG, ED     EKG  I, Nance Pear, attending physician, personally viewed and interpreted this EKG  EKG Time: 1704 Rate: 109 Rhythm: sinus tachycardia Axis: normal Intervals: qtc 454 QRS: narrow ST changes: no st elevation Impression: abnormal ekg   RADIOLOGY I independently interpreted and visualized the CXR. My interpretation: No  Radiology interpretation:  IMPRESSION:  1. No acute intrathoracic process.      PROCEDURES:  Critical Care performed: No  Procedures   MEDICATIONS ORDERED IN ED: Medications - No data to display   IMPRESSION / MDM / [place] / ED COURSE  I reviewed the triage vital signs and the nursing notes.                              Differential diagnosis includes, but is not limited to, pneumonia, asthmia exacerbation, viral infection.  Patient's presentation is most consistent with acute presentation with potential threat to life or bodily function.  Patient presented to the emergency department today because of concerns for shortness of breath.  On exam patient have some increased work of breathing.  Poor air movement diffusely.  Chest x-ray did not show evidence of pneumonia.  Patient did test positive for RSV.  Unfortunately we were unable to obtain any blood work.  However IV was established and patient was able to be given IV Solu-Medrol.  The patient was also given DuoNeb treatments.  She did feel better.  She did ask for discharge home.  At this time I think is reasonable.  Patient without any current oxygen requirement.  Will plan on discharging with prescription for further steroids.  FINAL CLINICAL IMPRESSION(S) / ED DIAGNOSES   Final diagnoses:  Exacerbation of asthma, unspecified asthma severity, unspecified whether persistent  RSV infection     Note:  This document was prepared using Dragon voice recognition software and may include unintentional dictation errors.    Nance Pear, MD 10/12/22 2312

## 2022-10-14 ENCOUNTER — Encounter: Payer: Self-pay | Admitting: Internal Medicine

## 2022-10-14 ENCOUNTER — Inpatient Hospital Stay
Admission: EM | Admit: 2022-10-14 | Discharge: 2022-10-16 | DRG: 193 | Disposition: A | Discharge status: Home / Self Care | Payer: BC Managed Care – PPO | Attending: Internal Medicine | Admitting: Internal Medicine

## 2022-10-14 ENCOUNTER — Other Ambulatory Visit: Payer: Self-pay

## 2022-10-14 ENCOUNTER — Emergency Department: Payer: BC Managed Care – PPO

## 2022-10-14 DIAGNOSIS — D500 Iron deficiency anemia secondary to blood loss (chronic): Secondary | ICD-10-CM | POA: Diagnosis not present

## 2022-10-14 DIAGNOSIS — Z23 Encounter for immunization: Secondary | ICD-10-CM | POA: Diagnosis not present

## 2022-10-14 DIAGNOSIS — Z91018 Allergy to other foods: Secondary | ICD-10-CM | POA: Diagnosis not present

## 2022-10-14 DIAGNOSIS — R7303 Prediabetes: Secondary | ICD-10-CM | POA: Diagnosis present

## 2022-10-14 DIAGNOSIS — J45901 Unspecified asthma with (acute) exacerbation: Secondary | ICD-10-CM | POA: Diagnosis present

## 2022-10-14 DIAGNOSIS — G4733 Obstructive sleep apnea (adult) (pediatric): Secondary | ICD-10-CM | POA: Diagnosis not present

## 2022-10-14 DIAGNOSIS — J9601 Acute respiratory failure with hypoxia: Secondary | ICD-10-CM | POA: Diagnosis present

## 2022-10-14 DIAGNOSIS — Z888 Allergy status to other drugs, medicaments and biological substances status: Secondary | ICD-10-CM

## 2022-10-14 DIAGNOSIS — J4521 Mild intermittent asthma with (acute) exacerbation: Secondary | ICD-10-CM | POA: Diagnosis present

## 2022-10-14 DIAGNOSIS — Z1152 Encounter for screening for COVID-19: Secondary | ICD-10-CM

## 2022-10-14 DIAGNOSIS — J4541 Moderate persistent asthma with (acute) exacerbation: Secondary | ICD-10-CM | POA: Diagnosis not present

## 2022-10-14 DIAGNOSIS — E662 Morbid (severe) obesity with alveolar hypoventilation: Secondary | ICD-10-CM | POA: Diagnosis present

## 2022-10-14 DIAGNOSIS — Z6841 Body Mass Index (BMI) 40.0 and over, adult: Secondary | ICD-10-CM | POA: Diagnosis not present

## 2022-10-14 DIAGNOSIS — Z79899 Other long term (current) drug therapy: Secondary | ICD-10-CM

## 2022-10-14 DIAGNOSIS — Z821 Family history of blindness and visual loss: Secondary | ICD-10-CM | POA: Diagnosis not present

## 2022-10-14 DIAGNOSIS — D509 Iron deficiency anemia, unspecified: Secondary | ICD-10-CM | POA: Diagnosis present

## 2022-10-14 DIAGNOSIS — J121 Respiratory syncytial virus pneumonia: Secondary | ICD-10-CM | POA: Diagnosis present

## 2022-10-14 DIAGNOSIS — Z8041 Family history of malignant neoplasm of ovary: Secondary | ICD-10-CM

## 2022-10-14 DIAGNOSIS — D649 Anemia, unspecified: Secondary | ICD-10-CM | POA: Diagnosis present

## 2022-10-14 DIAGNOSIS — Z881 Allergy status to other antibiotic agents status: Secondary | ICD-10-CM

## 2022-10-14 DIAGNOSIS — Z9102 Food additives allergy status: Secondary | ICD-10-CM

## 2022-10-14 DIAGNOSIS — Z833 Family history of diabetes mellitus: Secondary | ICD-10-CM | POA: Diagnosis not present

## 2022-10-14 DIAGNOSIS — G4730 Sleep apnea, unspecified: Secondary | ICD-10-CM | POA: Diagnosis present

## 2022-10-14 DIAGNOSIS — J189 Pneumonia, unspecified organism: Secondary | ICD-10-CM

## 2022-10-14 LAB — LACTIC ACID, PLASMA: Lactic Acid, Venous: 1 mmol/L (ref 0.5–1.9)

## 2022-10-14 LAB — CBC WITH DIFFERENTIAL/PLATELET
Abs Immature Granulocytes: 0.03 10*3/uL (ref 0.00–0.07)
Basophils Absolute: 0 10*3/uL (ref 0.0–0.1)
Basophils Relative: 0 %
Eosinophils Absolute: 0 10*3/uL (ref 0.0–0.5)
Eosinophils Relative: 0 %
HCT: 29.5 % — ABNORMAL LOW (ref 36.0–46.0)
Hemoglobin: 8.5 g/dL — ABNORMAL LOW (ref 12.0–15.0)
Immature Granulocytes: 0 %
Lymphocytes Relative: 9 %
Lymphs Abs: 0.7 10*3/uL (ref 0.7–4.0)
MCH: 23.7 pg — ABNORMAL LOW (ref 26.0–34.0)
MCHC: 28.8 g/dL — ABNORMAL LOW (ref 30.0–36.0)
MCV: 82.4 fL (ref 80.0–100.0)
Monocytes Absolute: 0.5 10*3/uL (ref 0.1–1.0)
Monocytes Relative: 6 %
Neutro Abs: 6.7 10*3/uL (ref 1.7–7.7)
Neutrophils Relative %: 85 %
Platelets: 280 10*3/uL (ref 150–400)
RBC: 3.58 MIL/uL — ABNORMAL LOW (ref 3.87–5.11)
RDW: 16.8 % — ABNORMAL HIGH (ref 11.5–15.5)
WBC: 7.9 10*3/uL (ref 4.0–10.5)
nRBC: 0 % (ref 0.0–0.2)

## 2022-10-14 LAB — COMPREHENSIVE METABOLIC PANEL
ALT: 23 U/L (ref 0–44)
AST: 23 U/L (ref 15–41)
Albumin: 3.1 g/dL — ABNORMAL LOW (ref 3.5–5.0)
Alkaline Phosphatase: 41 U/L (ref 38–126)
Anion gap: 9 (ref 5–15)
BUN: 16 mg/dL (ref 6–20)
CO2: 24 mmol/L (ref 22–32)
Calcium: 8.5 mg/dL — ABNORMAL LOW (ref 8.9–10.3)
Chloride: 104 mmol/L (ref 98–111)
Creatinine, Ser: 0.73 mg/dL (ref 0.44–1.00)
GFR, Estimated: >60 mL/min (ref >60)
Glucose, Bld: 120 mg/dL — ABNORMAL HIGH (ref 70–99)
Potassium: 3.7 mmol/L (ref 3.5–5.1)
Sodium: 137 mmol/L (ref 135–145)
Total Bilirubin: 0.7 mg/dL (ref 0.3–1.2)
Total Protein: 7.5 g/dL (ref 6.5–8.1)

## 2022-10-14 LAB — IRON AND TIBC
Iron: 19 ug/dL — ABNORMAL LOW (ref 28–170)
Saturation Ratios: 6 % — ABNORMAL LOW (ref 10.4–31.8)
TIBC: 298 ug/dL (ref 250–450)
UIBC: 279 ug/dL

## 2022-10-14 LAB — BRAIN NATRIURETIC PEPTIDE: B Natriuretic Peptide: 48.1 pg/mL (ref 0.0–100.0)

## 2022-10-14 LAB — BLOOD GAS, VENOUS
Acid-Base Excess: 2.5 mmol/L — ABNORMAL HIGH (ref 0.0–2.0)
Bicarbonate: 27.9 mmol/L (ref 20.0–28.0)
O2 Saturation: 70.4 %
Patient temperature: 37
pCO2, Ven: 45 mmHg (ref 44–60)
pH, Ven: 7.4 (ref 7.25–7.43)
pO2, Ven: 48 mmHg — ABNORMAL HIGH (ref 32–45)

## 2022-10-14 LAB — FERRITIN: Ferritin: 28 ng/mL (ref 11–307)

## 2022-10-14 LAB — TROPONIN I (HIGH SENSITIVITY): Troponin I (High Sensitivity): 6 ng/L (ref <18)

## 2022-10-14 LAB — HCG, QUANTITATIVE, PREGNANCY: hCG, Beta Chain, Quant, S: <1 m[IU]/mL (ref <5)

## 2022-10-14 LAB — PROCALCITONIN: Procalcitonin: <0.1 ng/mL

## 2022-10-14 MED ORDER — SODIUM CHLORIDE 0.9 % IV BOLUS
1000.0000 mL | Freq: Once | INTRAVENOUS | Status: AC
  Administered 2022-10-14: 1000 mL via INTRAVENOUS

## 2022-10-14 MED ORDER — METHYLPREDNISOLONE SODIUM SUCC 125 MG IJ SOLR
125.0000 mg | Freq: Once | INTRAMUSCULAR | Status: AC
  Administered 2022-10-14: 125 mg via INTRAVENOUS
  Filled 2022-10-14: qty 2

## 2022-10-14 MED ORDER — ENOXAPARIN SODIUM 40 MG/0.4ML IJ SOSY
40.0000 mg | PREFILLED_SYRINGE | INTRAMUSCULAR | Status: DC
  Administered 2022-10-14 – 2022-10-15 (×2): 40 mg via SUBCUTANEOUS
  Filled 2022-10-14 (×2): qty 0.4

## 2022-10-14 MED ORDER — IPRATROPIUM-ALBUTEROL 0.5-2.5 (3) MG/3ML IN SOLN
3.0000 mL | Freq: Once | RESPIRATORY_TRACT | Status: AC
  Administered 2022-10-14: 3 mL via RESPIRATORY_TRACT
  Filled 2022-10-14: qty 3

## 2022-10-14 MED ORDER — ONDANSETRON HCL 4 MG/2ML IJ SOLN: 4.0000 mg | Freq: Four times a day (QID) | INTRAMUSCULAR | Status: DC | PRN

## 2022-10-14 MED ORDER — ACETAMINOPHEN 325 MG PO TABS: 650.0000 mg | ORAL_TABLET | Freq: Four times a day (QID) | ORAL | Status: DC | PRN

## 2022-10-14 MED ORDER — ACETAMINOPHEN 650 MG RE SUPP: 650.0000 mg | Freq: Four times a day (QID) | RECTAL | Status: DC | PRN

## 2022-10-14 MED ORDER — MEDROXYPROGESTERONE ACETATE 10 MG PO TABS
10.0000 mg | ORAL_TABLET | Freq: Every day | ORAL | Status: DC
  Administered 2022-10-15 – 2022-10-16 (×2): 10 mg via ORAL
  Filled 2022-10-14 (×2): qty 1

## 2022-10-14 MED ORDER — METHYLPREDNISOLONE SODIUM SUCC 40 MG IJ SOLR
40.0000 mg | Freq: Every day | INTRAMUSCULAR | Status: DC
  Administered 2022-10-15 – 2022-10-16 (×2): 40 mg via INTRAVENOUS
  Filled 2022-10-14 (×2): qty 1

## 2022-10-14 MED ORDER — LEVOFLOXACIN IN D5W 750 MG/150ML IV SOLN
750.0000 mg | Freq: Once | INTRAVENOUS | Status: AC
  Administered 2022-10-14: 750 mg via INTRAVENOUS
  Filled 2022-10-14: qty 150

## 2022-10-14 MED ORDER — BUDESONIDE 0.25 MG/2ML IN SUSP
0.2500 mg | Freq: Two times a day (BID) | RESPIRATORY_TRACT | Status: DC
  Administered 2022-10-15 – 2022-10-16 (×3): 0.25 mg via RESPIRATORY_TRACT
  Filled 2022-10-14 (×3): qty 2

## 2022-10-14 MED ORDER — IPRATROPIUM-ALBUTEROL 0.5-2.5 (3) MG/3ML IN SOLN
3.0000 mL | Freq: Four times a day (QID) | RESPIRATORY_TRACT | Status: DC
  Administered 2022-10-15 – 2022-10-16 (×6): 3 mL via RESPIRATORY_TRACT
  Filled 2022-10-14 (×6): qty 3

## 2022-10-14 MED ORDER — ONDANSETRON HCL 4 MG PO TABS: 4.0000 mg | ORAL_TABLET | Freq: Four times a day (QID) | ORAL | Status: DC | PRN

## 2022-10-14 MED ORDER — MAGNESIUM SULFATE 2 GM/50ML IV SOLN
2.0000 g | Freq: Once | INTRAVENOUS | Status: AC
  Administered 2022-10-14: 2 g via INTRAVENOUS
  Filled 2022-10-14: qty 50

## 2022-10-14 MED ORDER — IBUPROFEN 400 MG PO TABS: 200.0000 mg | ORAL_TABLET | Freq: Four times a day (QID) | ORAL | Status: DC | PRN

## 2022-10-14 NOTE — ED Provider Notes (Signed)
[facility] Provider Note    Event Date/Time   First MD Initiated Contact with Patient 10/14/22 1435     (approximate)   History   Shortness of Breath   HPI  Sabrina Bell is a 21 y.o. female here with shortness of breath.  Patient has a history of asthma, sleep apnea, was recently diagnosed with RSV and placed on steroids and breathing treatments.  She presents today with worsening shortness of breath.  She states that she has felt progressively worse since her last ED evaluation.  She has had dyspnea but now is dyspneic at rest.  She has been winded and is having difficulty even getting across the room.  Denies any chest pain.  Denies any fevers.  She has had a dry, hacking cough.     Physical Exam   Triage Vital Signs: ED Triage Vitals  Enc Vitals Group     BP 10/14/22 1421 (!) 153/110     Pulse Rate 10/14/22 1421 (!) 112     Resp 10/14/22 1421 (!) 24     Temp 10/14/22 1421 98.1 F (36.7 C)     Temp Source 10/14/22 1421 Oral     SpO2 10/14/22 1421 99 %     Weight 10/14/22 1427 (!) 527 lb 9 oz (239.3 kg)     Height 10/14/22 1427 '5\' 7"'$  (1.702 m)     Head Circumference --      Peak Flow --      Pain Score 10/14/22 1427 0     Pain Loc --      Pain Edu? --      Excl. in [place]? --     Most recent vital signs: Vitals:   10/14/22 1421  BP: (!) 153/110  Pulse: (!) 112  Resp: (!) 24  Temp: 98.1 F (36.7 C)  SpO2: 99%     General: Awake, no distress.  CV:  Good peripheral perfusion.  Tachycardic. Resp:  Tachypneic with bilateral wheezes and diminished aeration.  Pursed lip breathing. Abd:  No distention.  Other:  Trace edema.   ED Results / Procedures / Treatments   Labs (all labs ordered are listed, but only abnormal results are displayed) Labs Reviewed  CULTURE, BLOOD (ROUTINE X 2)  CULTURE, BLOOD (ROUTINE X 2)  CBC WITH DIFFERENTIAL/PLATELET  COMPREHENSIVE METABOLIC PANEL  LACTIC ACID, PLASMA  LACTIC ACID, PLASMA  BRAIN  NATRIURETIC PEPTIDE  PROCALCITONIN  BLOOD GAS, VENOUS  TROPONIN I (HIGH SENSITIVITY)     EKG    RADIOLOGY Chest x-ray: Basilar pneumonia   I also independently reviewed and agree with radiologist interpretations.   PROCEDURES:  Critical Care performed: Yes, see critical care procedure note(s)  .Critical Care  Performed by: Duffy Bruce, MD Authorized by: Duffy Bruce, MD   Critical care provider statement:    Critical care time (minutes):  30   Critical care time was exclusive of:  Separately billable procedures and treating other patients   Critical care was necessary to treat or prevent imminent or life-threatening deterioration of the following conditions:  Circulatory failure, respiratory failure and cardiac failure   Critical care was time spent personally by me on the following activities:  Development of treatment plan with patient or surrogate, discussions with consultants, evaluation of patient's response to treatment, examination of patient, ordering and review of laboratory studies, ordering and review of radiographic studies, ordering and performing treatments and interventions, pulse oximetry, re-evaluation of patient's condition and review of old charts  Care discussed with: admitting provider       MEDICATIONS ORDERED IN ED: Medications  sodium chloride 0.9 % bolus 1,000 mL (has no administration in time range)  magnesium sulfate IVPB 2 g 50 mL (has no administration in time range)  ipratropium-albuterol (DUONEB) 0.5-2.5 (3) MG/3ML nebulizer solution 3 mL (has no administration in time range)  ipratropium-albuterol (DUONEB) 0.5-2.5 (3) MG/3ML nebulizer solution 3 mL (has no administration in time range)  ipratropium-albuterol (DUONEB) 0.5-2.5 (3) MG/3ML nebulizer solution 3 mL (has no administration in time range)  methylPREDNISolone sodium succinate (SOLU-MEDROL) 125 mg/2 mL injection 125 mg (has no administration in time range)  levofloxacin  (LEVAQUIN) IVPB 750 mg (has no administration in time range)     IMPRESSION / MDM / ASSESSMENT AND PLAN / ED COURSE  I reviewed the triage vital signs and the nursing notes.                              Differential diagnosis includes, but is not limited to, asthma exacerbation, recurrent pneumonia,   Patient's presentation is most consistent with acute presentation with potential threat to life or bodily function.  The patient is on the cardiac monitor to evaluate for evidence of arrhythmia and/or significant heart rate changes.  21 year old female with history of obesity, sleep apnea, asthma, here with shortness of breath.  Suspect ongoing hypoxic respiratory failure in the setting of RSV and now possible superimposed bacterial pneumonia.  Patient has diffuse wheezing and purse of breathing on arrival.  Will place on BiPAP, start IV steroids as well as DuoNebs x 3.  Will cover empirically with Levaquin as she has an allergy to cephalosporins.  She does not appear septic clinically.  Patient is in agreement with this plan.  Blood gas sent.  Plan to follow-up lab work and admit pending response to treatment     FINAL CLINICAL IMPRESSION(S) / ED DIAGNOSES   Final diagnoses:  Mild intermittent asthma with exacerbation  Community acquired pneumonia, unspecified laterality  Acute respiratory failure with hypoxia ([place])     Rx / DC Orders   ED Discharge Orders     None        Note:  This document was prepared using Dragon voice recognition software and may include unintentional dictation errors.   Duffy Bruce, MD 10/14/22 1525

## 2022-10-14 NOTE — ED Provider Notes (Signed)
I received this patient in signout with a history of asthma, sleep apnea, very elevated BMI, recent RSV.  She has what looks like pneumonia on chest x-ray so was given antibiotics by previous provider.  Put on BiPAP.  Admit.  Labs unremarkable.   Lucillie Garfinkel, MD 10/14/22 [phone]

## 2022-10-14 NOTE — ED Notes (Signed)
This RN assisted pt to ambulate to in room toilet on RA and change pants.  Pt SHOB getting back into bed, O2 sats 97%.  Placed back on bipap.

## 2022-10-14 NOTE — H&P (Signed)
History and Physical    Patient: Sabrina Bell AVW:[id number] DOB: 27-Sep-2001 DOA: 10/14/2022 DOS: the patient was seen and examined on 10/15/2022 PCP: Pcp, No  Patient coming from: Home  Chief Complaint:  Chief Complaint  Patient presents with   Shortness of Breath   HPI: Sabrina Bell is a 21 y.o. female with medical history significant of asthma and obesity.  She started having difficulty breathing a few days ago with some coughing.  She was seen in the emergency room on 10/12/2022 and diagnosed with RSV infection discharged home on prednisone.  She got worse and ended up coming back to the hospital.  She was seen in the emergency room and given IV magnesium, Solu-Medrol, Levaquin and placed on BiPAP.  Hospitalist services were contacted for evaluation Review of Systems: Review of Systems  Constitutional:  Negative for fever.  HENT:  Negative for hearing loss.   Eyes:  Negative for blurred vision.  Respiratory:  Positive for cough and shortness of breath.   Gastrointestinal:  Negative for abdominal pain, nausea and vomiting.  Genitourinary:  Negative for dysuria.  Musculoskeletal:  Negative for back pain.  Neurological:  Negative for dizziness.  Endo/Heme/Allergies:  Does not bruise/bleed easily.  Psychiatric/Behavioral:  Negative for depression.     Past Medical History:  Diagnosis Date   Asthma    Borderline diabetes    Depression    Migraines    Obesity    Past Surgical History:  Procedure Laterality Date   NO PAST SURGERIES     Social History:  reports that she has never smoked. She has never used smokeless tobacco. She reports that she does not drink alcohol and does not use drugs.  Allergies  Allergen Reactions   Cashew Nut Oil Anaphylaxis   Citrullus Vulgaris Itching   Omnicef [Cefdinir] Hives and Other (See Comments)   Other Other (See Comments)    Allergic to walnuts, pecans and almonds per allergy test   Watermelon Flavor [Flavoring Agent] Other (See  Comments)    Felt like her throat was itchy and closing    Family History  Problem Relation Age of Onset   Diabetes Mother    Vision loss Mother    Ovarian cancer Paternal Grandmother    Prostate cancer Paternal Grandfather     Prior to Admission medications   Medication Sig Start Date End Date Taking? Authorizing Provider  albuterol (VENTOLIN HFA) 108 (90 Base) MCG/ACT inhaler Inhale 2 puffs into the lungs every 4 (four) hours as needed for wheezing or shortness of breath. 06/08/22  Yes Duffy Bruce, MD  medroxyPROGESTERone (PROVERA) 10 MG tablet TAKE 1 TABLET BY MOUTH EVERY DAY 10/07/22  Yes Harlin Heys, MD  predniSONE (DELTASONE) 20 MG tablet Take 2 tablets (40 mg total) by mouth daily with breakfast for 5 days. 10/12/22 10/17/22 Yes Nance Pear, MD  EPINEPHrine 0.3 mg/0.3 mL IJ SOAJ injection Inject 0.3 mLs (0.3 mg total) into the muscle as needed for anaphylaxis. 04/28/20   Rudene Re, MD  ferrous sulfate 325 (65 FE) MG EC tablet Take 1 tablet (325 mg total) by mouth daily with breakfast. 06/08/22 07/25/22  Duffy Bruce, MD  ibuprofen (ADVIL) 200 MG tablet Take 200 mg by mouth every 6 (six) hours as needed for headache.    [provider]  fluticasone (FLOVENT HFA) 110 MCG/ACT inhaler Inhale 2 puffs into the lungs 2 (two) times daily. 11/06/18 05/30/20  Wilber Oliphant, MD    Physical Exam: Vitals:   10/15/22  0530 10/15/22 0831 10/15/22 1000 10/15/22 1215  BP: 134/75  (!) 148/68 136/82  Pulse: 84  68 80  Resp: 20  20 (!) 21  Temp:  97.7 F (36.5 C)    TempSrc:  Oral    SpO2: 95%  92% 92%  Weight:      Height:       Physical Exam HENT:     Head: Normocephalic.     Mouth/Throat:     Pharynx: No oropharyngeal exudate.  Eyes:     General: Lids are normal.     Conjunctiva/sclera: Conjunctivae normal.  Cardiovascular:     Rate and Rhythm: Normal rate and regular rhythm.     Heart sounds: Normal heart sounds, S1 normal and S2 normal.  Pulmonary:      Breath sounds: Examination of the right-lower field reveals decreased breath sounds and wheezing. Examination of the left-lower field reveals decreased breath sounds and wheezing. Decreased breath sounds and wheezing present. No rhonchi or rales.  Abdominal:     Palpations: Abdomen is soft.     Tenderness: There is no abdominal tenderness.  Musculoskeletal:     Right lower leg: Swelling present.     Left lower leg: Swelling present.  Skin:    General: Skin is warm.     Findings: No rash.     Comments: Secondary excoriations lower extremities  Neurological:     Mental Status: She is alert and oriented to person, place, and time.     Data Reviewed: Creatinine 0.73, BNP 48.1, lactic acid 1, hemoglobin 8.5 Interpretation by me.  EKG with sinus tachycardia 109 bpm.  Assessment and Plan: * Acute respiratory failure with hypoxia (HCC) Patient coming in with respiratory distress and placed on BiPAP.  Since patient off continuous BiPAP, I will change to medical floor.  Patient this morning was on 4 L of oxygen.  Asthma exacerbation Solu-Medrol and nebulizer treatments ordered.  Patient also received IV magnesium in the ER.  RSV (respiratory syncytial virus pneumonia) Supportive care.  Continue steroids and nebulizer treatments.  ER physician ordered a dose of Levaquin.  Since procalcitonin negative x 2 I will not continue the IV antibiotics.  Morbid obesity with BMI of 70 and over, adult ([place]) With current height and weight in computer BMI 82.63  Iron deficiency anemia Hemoglobin went down to 7.9.  Ferritin low at 28.  I will give IV Venofer.  Continue Provera.  Sleep apnea Obstructive sleep apnea.  Patient does not wear a CPAP at night.      Advance Care Planning:   Code Status: Full Code   Consults: None  Family Communication: Fianc at bedside  Severity of Illness: The appropriate patient status for this patient is INPATIENT. Inpatient status is judged to be reasonable  and necessary in order to provide the required intensity of service to ensure the patient's safety. The patient's presenting symptoms, physical exam findings, and initial radiographic and laboratory data in the context of their chronic comorbidities is felt to place them at high risk for further clinical deterioration. Furthermore, it is not anticipated that the patient will be medically stable for discharge from the hospital within 2 midnights of admission.   * I certify that at the point of admission it is my clinical judgment that the patient will require inpatient hospital care spanning beyond 2 midnights from the point of admission due to high intensity of service, high risk for further deterioration and high frequency of surveillance required.*  Author: Loletha Grayer,  MD 10/15/2022 1:49 PM  For on call review [email or web address].

## 2022-10-14 NOTE — Assessment & Plan Note (Addendum)
Supportive care.  Continue steroids and nebulizer treatments.  ER physician ordered a dose of Levaquin.  Will check procalcitonin before continuing.

## 2022-10-14 NOTE — Assessment & Plan Note (Deleted)
Hemoglobin 8.5.  Will add on iron studies.  Since the patient is on Provera on assuming she has iron deficiency anemia but will check iron studies.

## 2022-10-14 NOTE — Assessment & Plan Note (Signed)
With current height and weight in computer BMI 82.63

## 2022-10-14 NOTE — Assessment & Plan Note (Addendum)
Patient coming in with respiratory distress and placed on BiPAP.  Since patient off continuous BiPAP, I will change to medical floor.  Patient this morning was on 4 L of oxygen.

## 2022-10-14 NOTE — Assessment & Plan Note (Signed)
Solu-Medrol and nebulizer treatments ordered.  Patient also received IV magnesium in the ER.

## 2022-10-14 NOTE — Assessment & Plan Note (Signed)
Obstructive sleep apnea.  Patient does not wear a CPAP at night.

## 2022-10-14 NOTE — ED Triage Notes (Signed)
From home diagnosed with RSV x2 days ago, home on Prednisone and Albuterol. C/O increasing SOB X 4 days, no better since last ED visit. EMS gave Albuterol neb, Duo neb with no change.

## 2022-10-15 DIAGNOSIS — J121 Respiratory syncytial virus pneumonia: Secondary | ICD-10-CM | POA: Diagnosis not present

## 2022-10-15 DIAGNOSIS — J9601 Acute respiratory failure with hypoxia: Secondary | ICD-10-CM | POA: Diagnosis not present

## 2022-10-15 DIAGNOSIS — D500 Iron deficiency anemia secondary to blood loss (chronic): Secondary | ICD-10-CM

## 2022-10-15 DIAGNOSIS — J4541 Moderate persistent asthma with (acute) exacerbation: Secondary | ICD-10-CM | POA: Diagnosis not present

## 2022-10-15 LAB — BASIC METABOLIC PANEL
Anion gap: 9 (ref 5–15)
BUN: 12 mg/dL (ref 6–20)
CO2: 24 mmol/L (ref 22–32)
Calcium: 8.6 mg/dL — ABNORMAL LOW (ref 8.9–10.3)
Chloride: 104 mmol/L (ref 98–111)
Creatinine, Ser: 0.55 mg/dL (ref 0.44–1.00)
GFR, Estimated: >60 mL/min (ref >60)
Glucose, Bld: 118 mg/dL — ABNORMAL HIGH (ref 70–99)
Potassium: 4.3 mmol/L (ref 3.5–5.1)
Sodium: 137 mmol/L (ref 135–145)

## 2022-10-15 LAB — PROCALCITONIN: Procalcitonin: <0.1 ng/mL

## 2022-10-15 LAB — TROPONIN I (HIGH SENSITIVITY): Troponin I (High Sensitivity): 2 ng/L (ref <18)

## 2022-10-15 LAB — CBC
HCT: 27.3 % — ABNORMAL LOW (ref 36.0–46.0)
Hemoglobin: 7.9 g/dL — ABNORMAL LOW (ref 12.0–15.0)
MCH: 24 pg — ABNORMAL LOW (ref 26.0–34.0)
MCHC: 28.9 g/dL — ABNORMAL LOW (ref 30.0–36.0)
MCV: 83 fL (ref 80.0–100.0)
Platelets: 231 10*3/uL (ref 150–400)
RBC: 3.29 MIL/uL — ABNORMAL LOW (ref 3.87–5.11)
RDW: 16.6 % — ABNORMAL HIGH (ref 11.5–15.5)
WBC: 4.7 10*3/uL (ref 4.0–10.5)
nRBC: 0 % (ref 0.0–0.2)

## 2022-10-15 LAB — VITAMIN B12: Vitamin B-12: 343 pg/mL (ref 180–914)

## 2022-10-15 MED ORDER — SODIUM CHLORIDE 0.9 % IV SOLN
300.0000 mg | Freq: Once | INTRAVENOUS | Status: AC
  Administered 2022-10-15: 300 mg via INTRAVENOUS
  Filled 2022-10-15: qty 300

## 2022-10-15 MED ORDER — GUAIFENESIN-DM 100-10 MG/5ML PO SYRP
5.0000 mL | ORAL_SOLUTION | ORAL | Status: DC | PRN
  Administered 2022-10-15 – 2022-10-16 (×2): 5 mL via ORAL
  Filled 2022-10-15 (×2): qty 10

## 2022-10-15 MED ORDER — PNEUMOCOCCAL 20-VAL CONJ VACC 0.5 ML IM SUSY: 0.5000 mL | PREFILLED_SYRINGE | INTRAMUSCULAR | Status: DC

## 2022-10-15 MED ORDER — INFLUENZA VAC SPLIT QUAD 0.5 ML IM SUSY
0.5000 mL | PREFILLED_SYRINGE | INTRAMUSCULAR | Status: AC
  Administered 2022-10-16: 0.5 mL via INTRAMUSCULAR
  Filled 2022-10-15: qty 0.5

## 2022-10-15 NOTE — Assessment & Plan Note (Signed)
Hemoglobin went down to 7.9.  Ferritin low at 28.  I will give IV Venofer.  Continue Provera.

## 2022-10-15 NOTE — ED Notes (Signed)
Pt ambulated to in room toilet and back on RA.  O2 sat noted to be 85 RA upon return to bed.

## 2022-10-15 NOTE — Progress Notes (Signed)
  Progress Note   Patient: Sabrina Bell IRC:[id number] DOB: 14-Oct-2001 DOA: 10/14/2022     1 DOS: the patient was seen and examined on 10/15/2022   Brief hospital course: 21 year old female with asthma and obesity presents to the hospital with RSV infection that is worsening.  She initially was placed on BiPAP and started on steroids.  Patient still wheezing.  Assessment and Plan: * Acute respiratory failure with hypoxia ([place]) Patient coming in with respiratory distress and placed on BiPAP.  Since patient off continuous BiPAP, I will change to medical floor.  Patient this morning was on 4 L of oxygen.  Asthma exacerbation Solu-Medrol and nebulizer treatments ordered.  Patient also received IV magnesium in the ER.  RSV (respiratory syncytial virus pneumonia) Supportive care.  Continue steroids and nebulizer treatments.  ER physician ordered a dose of Levaquin.  Since procalcitonin negative x 2 I will not continue the IV antibiotics.  Morbid obesity with BMI of 70 and over, adult ([place]) With current height and weight in computer BMI 82.63  Iron deficiency anemia Hemoglobin went down to 7.9.  Ferritin low at 28.  I will give IV Venofer.  Continue Provera.  Sleep apnea Obstructive sleep apnea.  Patient does not wear a CPAP at night.        Subjective: Patient feeling a little bit better than when she came in.  Still coughing and still short of breath and wheezing.  Admitted with RSV pneumonia.  Physical Exam: Vitals:   10/15/22 0530 10/15/22 0831 10/15/22 1000 10/15/22 1215  BP: 134/75  (!) 148/68 136/82  Pulse: 84  68 80  Resp: 20  20 (!) 21  Temp:  97.7 F (36.5 C)    TempSrc:  Oral    SpO2: 95%  92% 92%  Weight:      Height:       Physical Exam HENT:     Head: Normocephalic.     Mouth/Throat:     Pharynx: No oropharyngeal exudate.  Eyes:     General: Lids are normal.     Conjunctiva/sclera: Conjunctivae normal.  Cardiovascular:     Rate and Rhythm: Normal rate  and regular rhythm.     Heart sounds: Normal heart sounds, S1 normal and S2 normal.  Pulmonary:     Breath sounds: Examination of the right-middle field reveals wheezing. Examination of the left-middle field reveals wheezing. Examination of the right-lower field reveals decreased breath sounds and wheezing. Examination of the left-lower field reveals decreased breath sounds and wheezing. Decreased breath sounds and wheezing present. No rhonchi or rales.  Abdominal:     Palpations: Abdomen is soft.     Tenderness: There is no abdominal tenderness.  Musculoskeletal:     Right lower leg: Swelling present.     Left lower leg: Swelling present.  Skin:    General: Skin is warm.     Findings: No rash.     Comments: Secondary excoriations bilateral legs.  Neurological:     Mental Status: She is alert and oriented to person, place, and time.     Data Reviewed: Creatinine 0.55, hemoglobin 7.9, ferritin 6  Family Communication: Spoke with fianc yesterday  Disposition: Status is: Inpatient Remains inpatient appropriate because: Still with bronchospasm with RSV pneumonia  Planned Discharge Destination: Home    Time spent: 28 minutes  Author: Loletha Grayer, MD 10/15/2022 1:47 PM  For on call review [email or web address].

## 2022-10-15 NOTE — ED Notes (Signed)
Pt titrated to 6L [place], [facility].

## 2022-10-15 NOTE — ED Notes (Signed)
Pt desatting on 4L [place], titrated to 5L  and tolerating well. [facility], call light in reach.

## 2022-10-15 NOTE — ED Notes (Signed)
Pt placed on 4L [place], maintaining O2 sats of 92%, respirations are even an unlabored at this time.

## 2022-10-15 NOTE — Hospital Course (Signed)
21 year old female with asthma and obesity presents to the hospital with RSV infection that is worsening.  She initially was placed on BiPAP and started on steroids.  Patient still wheezing.

## 2022-10-15 NOTE — ED Notes (Signed)
Pt ambulatory to and from restroom without assistance, with oxygen. Linens changed on bed. Meal tray to bedside. Apple juice provided per request, pt denies further needs at this time, call light in reach, [facility].

## 2022-10-16 DIAGNOSIS — J4521 Mild intermittent asthma with (acute) exacerbation: Secondary | ICD-10-CM

## 2022-10-16 DIAGNOSIS — J9601 Acute respiratory failure with hypoxia: Secondary | ICD-10-CM | POA: Diagnosis not present

## 2022-10-16 DIAGNOSIS — J121 Respiratory syncytial virus pneumonia: Secondary | ICD-10-CM | POA: Diagnosis not present

## 2022-10-16 LAB — PROCALCITONIN: Procalcitonin: 0.11 ng/mL

## 2022-10-16 LAB — CBC
HCT: 27.8 % — ABNORMAL LOW (ref 36.0–46.0)
Hemoglobin: 7.9 g/dL — ABNORMAL LOW (ref 12.0–15.0)
MCH: 23.5 pg — ABNORMAL LOW (ref 26.0–34.0)
MCHC: 28.4 g/dL — ABNORMAL LOW (ref 30.0–36.0)
MCV: 82.7 fL (ref 80.0–100.0)
Platelets: 270 10*3/uL (ref 150–400)
RBC: 3.36 MIL/uL — ABNORMAL LOW (ref 3.87–5.11)
RDW: 16.6 % — ABNORMAL HIGH (ref 11.5–15.5)
WBC: 8.5 10*3/uL (ref 4.0–10.5)
nRBC: 0.6 % — ABNORMAL HIGH (ref 0.0–0.2)

## 2022-10-16 MED ORDER — AZITHROMYCIN 250 MG PO TABS: ORAL_TABLET | ORAL | 0 refills | Status: AC

## 2022-10-16 MED ORDER — FE FUM-VIT C-VIT B12-FA 460-60-0.01-1 MG PO CAPS
1.0000 | ORAL_CAPSULE | Freq: Every day | ORAL | Status: DC
  Administered 2022-10-16: 1 via ORAL
  Filled 2022-10-16: qty 1

## 2022-10-16 MED ORDER — ENOXAPARIN SODIUM 60 MG/0.6ML IJ SOSY
0.2500 mg/kg | PREFILLED_SYRINGE | Freq: Two times a day (BID) | INTRAMUSCULAR | Status: DC
  Administered 2022-10-16: 60 mg via SUBCUTANEOUS
  Filled 2022-10-16: qty 0.6

## 2022-10-16 MED ORDER — FE FUM-VIT C-VIT B12-FA 460-60-0.01-1 MG PO CAPS: 1.0000 | ORAL_CAPSULE | Freq: Every day | ORAL | 0 refills | Status: DC

## 2022-10-16 MED ORDER — GUAIFENESIN-DM 100-10 MG/5ML PO SYRP: 5.0000 mL | ORAL_SOLUTION | ORAL | 0 refills | Status: DC | PRN

## 2022-10-16 MED ORDER — PREDNISONE 50 MG PO TABS: ORAL_TABLET | ORAL | 0 refills | Status: DC

## 2022-10-16 NOTE — Discharge Summary (Signed)
Physician Discharge Summary   Patient: Sabrina Bell MRN: [id number] DOB: 11-Apr-2001  Admit date:     10/14/2022  Discharge date: 10/16/22  Discharge Physician: Lorella Nimrod   PCP: Pcp, No   Recommendations at discharge:  Please obtain CBC and BMP in 1 week. Patient will need sleep study as an outpatient to get BiPAP or CPAP accordingly. Follow-up with primary care provider within a week Please reassess the need of oxygen during follow-up visit.  Currently she is being discharged on 4 L of oxygen, she will need some with sleep until she gets her proper equipment. Please provide referral for weight loss clinic.  Discharge Diagnoses: Principal Problem:   Acute respiratory failure with hypoxia (HCC) Active Problems:   Asthma exacerbation   RSV (respiratory syncytial virus pneumonia)   Morbid obesity with BMI of 70 and over, adult [facility])   Sleep apnea   Iron deficiency anemia   Hospital Course: 21 year old female with asthma and morbid obesity presents to the hospital with RSV infection that is worsening.  She initially was placed on BiPAP and started on steroids.  Patient still wheezing. Chest x-ray with patchy airspace opacities bilaterally concerning for pneumonia.  Borderline enlarged cardiac silhouette today with pulmonary vascular congestion.  BNP at 48  12/27: Vitals normal.  Procalcitonin remain negative.  CBC stable with hemoglobin of 7.9.  B12 at 343.  Iron studies with iron deficiency, received 1 dose of IV Venofer on 10/15/2022. Starting on supplement.  No wheezing today but she continued to require up to 4 L of oxygen. Apparently she was using BiPAP in the past but her dad threw that away.  Patient with morbid obesity and BMI close to 83 will need some help due to obstructive sleep apnea and obesity related hypoventilation syndrome. We ordered home oxygen and she will need to have a close follow-up with a pulmonologist and a primary care provider and will need a sleep  study to obtain the proper equipment.  Patient also need help for weight loss which can be provided by PCP. She will get benefit from a weight loss clinic and follow-up with the bariatric surgeon if needed.  She was given few more days of prednisone and Zithromax to help decrease the inflammation in her lungs.  She will continue on current medications and need to have a close follow-up by PCP for further recommendations.  Assessment and Plan: * Acute respiratory failure with hypoxia ([place]) Patient coming in with respiratory distress and placed on BiPAP.  Since patient off continuous BiPAP, I will change to medical floor.  Patient this morning was on 4 L of oxygen.  Asthma exacerbation Solu-Medrol and nebulizer treatments ordered.  Patient also received IV magnesium in the ER.  RSV (respiratory syncytial virus pneumonia) Supportive care.  Continue steroids and nebulizer treatments.  ER physician ordered a dose of Levaquin.  Since procalcitonin negative x 2 I will not continue the IV antibiotics.  Morbid obesity with BMI of 70 and over, adult ([place]) With current height and weight in computer BMI 82.63  Iron deficiency anemia Hemoglobin went down to 7.9.  Ferritin low at 28.  I will give IV Venofer.  Continue Provera.  Sleep apnea Obstructive sleep apnea.  Patient does not wear a CPAP at night.   Consultants: None Procedures performed: None Disposition: Home Diet recommendation:  Discharge Diet Orders (From admission, onward)     Start     Ordered   10/16/22 0000  Diet - low sodium heart healthy  10/16/22 1409           Regular diet DISCHARGE MEDICATION: Allergies as of 10/16/2022       Reactions   Cashew Nut Oil Anaphylaxis   Citrullus Vulgaris Itching   Omnicef [cefdinir] Hives, Other (See Comments)   Other Other (See Comments)   Allergic to walnuts, pecans and almonds per allergy test   Watermelon Flavor [flavoring Agent] Other (See Comments)   Felt like her  throat was itchy and closing        Medication List     STOP taking these medications    ferrous sulfate 325 (65 FE) MG EC tablet       TAKE these medications    albuterol 108 (90 Base) MCG/ACT inhaler Commonly known as: VENTOLIN HFA Inhale 2 puffs into the lungs every 4 (four) hours as needed for wheezing or shortness of breath.   azithromycin 250 MG tablet Commonly known as: Zithromax Z-Pak Take 2 tablets (500 mg) on  Day 1,  followed by 1 tablet (250 mg) once daily on Days 2 through 5.   EPINEPHrine 0.3 mg/0.3 mL Soaj injection Commonly known as: EPI-PEN Inject 0.3 mLs (0.3 mg total) into the muscle as needed for anaphylaxis.   Fe Fum-Vit C-Vit B12-FA Caps capsule Commonly known as: TRIGELS-F FORTE Take 1 capsule by mouth daily after breakfast. Start taking on: October 17, 2022   guaiFENesin-dextromethorphan 100-10 MG/5ML syrup Commonly known as: ROBITUSSIN DM Take 5 mLs by mouth every 4 (four) hours as needed for cough.   ibuprofen 200 MG tablet Commonly known as: ADVIL Take 200 mg by mouth every 6 (six) hours as needed for headache.   medroxyPROGESTERone 10 MG tablet Commonly known as: PROVERA TAKE 1 TABLET BY MOUTH EVERY DAY   predniSONE 50 MG tablet Commonly known as: DELTASONE Take 50 mg daily for the next 5 days What changed:  medication strength how much to take how to take this when to take this additional instructions               Durable Medical Equipment  (From admission, onward)           Start     Ordered   10/16/22 1400  For home use only DME oxygen  Once       Question Answer Comment  Length of Need Lifetime   Mode or (Route) Nasal cannula   Liters per Minute 4   Frequency Continuous (stationary and portable oxygen unit needed)   Oxygen conserving device Yes   Oxygen delivery system Gas      10/16/22 1359            Discharge Exam: Filed Weights   10/14/22 1427  Weight: (!) 239.3 kg   General.  Morbidly  obese young lady, in no acute distress. Pulmonary.  Lungs clear bilaterally, normal respiratory effort. CV.  Regular rate and rhythm, no JVD, rub or murmur. Abdomen.  Soft, nontender, nondistended, BS positive. CNS.  Alert and oriented .  No focal neurologic deficit. Extremities.  No edema, no cyanosis, pulses intact and symmetrical. Psychiatry.  Judgment and insight appears normal.   Condition at discharge: stable  The results of significant diagnostics from this hospitalization (including imaging, microbiology, ancillary and laboratory) are listed below for reference.   Imaging Studies: DG Chest Port 1 View  Result Date: 10/14/2022 CLINICAL DATA:  Shortness of breath EXAM: PORTABLE CHEST 1 VIEW COMPARISON:  Radiograph 10/12/2022 FINDINGS: There are patchy airspace opacities bilaterally in the  mid lungs, new from prior. Borderline enlarged cardiac silhouette. No pleural effusion or evidence of pneumothorax. No acute osseous abnormality. IMPRESSION: Patchy airspace opacities bilaterally concerning for pneumonia. Borderline enlarged cardiac silhouette with pulmonary vascular congestion. Electronically Signed   By: Maurine Simmering M.D.   On: 10/14/2022 15:05   DG Chest Port 1 View  Result Date: 10/12/2022 CLINICAL DATA:  Short of breath, cough EXAM: PORTABLE CHEST 1 VIEW COMPARISON:  07/24/2022 FINDINGS: Single frontal view of the chest demonstrates a stable cardiac silhouette. No airspace disease, effusion, or pneumothorax. No acute bony abnormality. IMPRESSION: 1. No acute intrathoracic process. Electronically Signed   By: Randa Ngo M.D.   On: 10/12/2022 18:26    Microbiology: Results for orders placed or performed during the hospital encounter of 10/14/22  Blood culture (routine x 2)     Status: None (Preliminary result)   Collection Time: 10/14/22  3:13 PM   Specimen: BLOOD  Result Value Ref Range Status   Specimen Description BLOOD BLOOD LEFT ARM  Final   Special Requests   Final     BOTTLES DRAWN AEROBIC AND ANAEROBIC Blood Culture adequate volume   Culture   Final    NO GROWTH 2 DAYS Performed at [facility], [address] [place], [place] [address]    Report Status PENDING  Incomplete  Blood culture (routine x 2)     Status: None (Preliminary result)   Collection Time: 10/14/22  3:13 PM   Specimen: BLOOD RIGHT HAND  Result Value Ref Range Status   Specimen Description BLOOD RIGHT HAND  Final   Special Requests   Final    BOTTLES DRAWN AEROBIC AND ANAEROBIC Blood Culture adequate volume   Culture   Final    NO GROWTH 2 DAYS Performed at [facility], [address] [place], [place] [address]    Report Status PENDING  Incomplete    Labs: CBC: Recent Labs  Lab 10/14/22 1513 10/15/22 0426 10/16/22 0425  WBC 7.9 4.7 8.5  NEUTROABS 6.7  --   --   HGB 8.5* 7.9* 7.9*  HCT 29.5* 27.3* 27.8*  MCV 82.4 83.0 82.7  PLT 280 231 623   Basic Metabolic Panel: Recent Labs  Lab 10/14/22 1513 10/15/22 0426  NA 137 137  K 3.7 4.3  CL 104 104  CO2 24 24  GLUCOSE 120* 118*  BUN 16 12  CREATININE 0.73 0.55  CALCIUM 8.5* 8.6*   Liver Function Tests: Recent Labs  Lab 10/14/22 1513  AST 23  ALT 23  ALKPHOS 41  BILITOT 0.7  PROT 7.5  ALBUMIN 3.1*   CBG: No results for input(s): "GLUCAP" in the last 168 hours.  Discharge time spent: greater than 30 minutes.  This record has been created using Systems analyst. Errors have been sought and corrected,but may not always be located. Such creation errors do not reflect on the standard of care.   Signed: Lorella Nimrod, MD [facility] 10/16/2022

## 2022-10-16 NOTE — Plan of Care (Signed)

## 2022-10-16 NOTE — Progress Notes (Signed)
Attempted walk test. Initially had patient siton the side of the bed wihtout oxygen and her Sats deopped to 87-88%. Placed her on 2L [place] POX 89-90%. Increased flow rate to 3L [place] 91%. Finally was able to get her above 92% on 4L.Pt walked in room but became very winded

## 2022-10-16 NOTE — TOC Transition Note (Addendum)
Transition of Care [facility]) - CM/SW Discharge Note   Patient Details  Name: Sabrina Bell MRN: [id number] Date of Birth: 06-22-2001  Transition of Care [facility]) CM/SW Contact:  Beverly Sessions, RN Phone Number: 10/16/2022, 2:32 PM   Clinical Narrative:      Patient to discharge today Lives at home with cousin who will be picking her up at discharge Patient states that She has a new patient appointment with Dr Garnette Gunner in April.  Unit secretary to call to see if appointment can be moved up  Patient confirms she has a nebulizer at home Patient to discharge with home O2 Referral made to [facility] with [employer]. To deliver portable O2 to bedside for discharge   Per MD to follow up outpatient for sleep study        Patient Goals and CMS Choice      Discharge Placement                         Discharge Plan and Services Additional resources added to the After Visit Summary for                                       Social Determinants of Health (SDOH) Interventions SDOH Screenings   Food Insecurity: No Food Insecurity (10/15/2022)  Housing: Low Risk  (10/15/2022)  Transportation Needs: No Transportation Needs (10/15/2022)  Utilities: Not At Risk (10/15/2022)  Depression (PHQ2-9): Low Risk  (12/15/2019)  Tobacco Use: Low Risk  (10/14/2022)     Readmission Risk Interventions     No data to display

## 2022-10-16 NOTE — Progress Notes (Signed)
Pt discharged. Provided discharge instructions and education. Denies any further concerns at this time. Portable oxygen delivered. Family here to pick up patient.

## 2022-10-16 NOTE — TOC Initial Note (Signed)
Transition of Care [facility]) - Initial/Assessment Note    Patient Details  Name: Sabrina Bell MRN: [id number] Date of Birth: Jul 15, 2001  Transition of Care [facility]) CM/SW Contact:    Beverly Sessions, RN Phone Number: 10/16/2022, 10:13 AM  Clinical Narrative:                  Patient admitted from home with RSV No PCP listed on file or in Care everywhere.  Added list of local PCP t the AVS Patient currently requiring 6L acute O2 and Bipap.  Per Chart review has nebulizer at home.   Please consult TOC if home o2 or other DME required at discharge        Patient Goals and CMS Choice            Expected Discharge Plan and Services                                              Prior Living Arrangements/Services                       Activities of Daily Living Home Assistive Devices/Equipment: Eyeglasses ADL Screening (condition at time of admission) Patient's cognitive ability adequate to safely complete daily activities?: Yes Is the patient deaf or have difficulty hearing?: No Does the patient have difficulty seeing, even when wearing glasses/contacts?: No Does the patient have difficulty concentrating, remembering, or making decisions?: No Patient able to express need for assistance with ADLs?: Yes Does the patient have difficulty dressing or bathing?: No Independently performs ADLs?: Yes (appropriate for developmental age) Does the patient have difficulty walking or climbing stairs?: Yes Weakness of Legs: Both Weakness of Arms/Hands: None  Permission Sought/Granted                  Emotional Assessment              Admission diagnosis:  Mild intermittent asthma with exacerbation [J45.21] Acute respiratory failure with hypoxia ([place]) [J96.01] Community acquired pneumonia, unspecified laterality [J18.9] Patient Active Problem List   Diagnosis Date Noted   Acute respiratory failure with hypoxia ([place]) 10/14/2022   RSV (respiratory  syncytial virus pneumonia) 10/14/2022   History of NSVT (nonsustained ventricular tachycardia) ([place]) 07/25/2022   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)    Essential hypertension    Insomnia 11/29/2015   Anxiety and depression 11/28/2015   PCP:  Pcp, No Pharmacy:   [id number] [place] [place]- [address] [place] [id number]: [phone]: [phone]    Social Determinants of Health (SDOH) Social History: [place] No Food Insecurity (10/15/2022)  Housing: Low Risk  (10/15/2022)  Transportation Needs: No Transportation Needs (10/15/2022)  Utilities: Not At Risk (10/15/2022)  Depression (PHQ2-9): Low Risk  (12/15/2019)  Tobacco Use: Low Risk  (10/14/2022)   SDOH Interventions:     Readmission Risk Interventions     No data to display

## 2022-10-16 NOTE — Progress Notes (Signed)
PHARMACIST - PHYSICIAN COMMUNICATION  CONCERNING:  Enoxaparin (Lovenox) for DVT Prophylaxis    RECOMMENDATION: Patient was prescribed enoxaprin '40mg'$  q24 hours for VTE prophylaxis.   Filed Weights   10/14/22 1427  Weight: (!) 239.3 kg (527 lb 9 oz)    Body mass index is 82.63 kg/m.  Estimated Creatinine Clearance: 235 mL/min (by C-G formula based on SCr of 0.55 mg/dL).   Based on [place] patient is candidate for enoxaparin 0.'5mg'$ /kg TBW SQ every 24 hours based on BMI being >30.   DESCRIPTION: Pharmacy has adjusted enoxaparin dose per [facility] policy. Discussed plan with MD to divide doses into q12 dosing.   Patient is now receiving enoxaparin 60 mg every 12 hours    Darrick Penna, PharmD Clinical Pharmacist  10/16/2022 7:46 AM

## 2022-10-16 NOTE — Discharge Instructions (Signed)
Some PCP options in [place] area- not a comprehensive list  [facility]- [place]- [phone] [facility] [place]- [place]- [phone]  or [facility] Physician Referral Line [phone]

## 2022-10-17 DIAGNOSIS — J9601 Acute respiratory failure with hypoxia: Secondary | ICD-10-CM | POA: Diagnosis not present

## 2022-10-17 LAB — TYPE AND SCREEN
ABO/RH(D): A POS
Antibody Screen: NEGATIVE

## 2022-10-19 LAB — CULTURE, BLOOD (ROUTINE X 2)
Culture: NO GROWTH
Culture: NO GROWTH
Special Requests: ADEQUATE
Special Requests: ADEQUATE

## 2022-10-24 ENCOUNTER — Telehealth: Payer: Self-pay

## 2022-10-24 NOTE — Telephone Encounter (Signed)
Spoke patient's father, Brain(DPR). He stated that patient is not currently wearing cpap. Nothing further needed.

## 2022-10-25 ENCOUNTER — Institutional Professional Consult (permissible substitution): Payer: BC Managed Care – PPO | Admitting: Pulmonary Disease

## 2022-11-17 DIAGNOSIS — J9601 Acute respiratory failure with hypoxia: Secondary | ICD-10-CM | POA: Diagnosis not present

## 2022-11-26 ENCOUNTER — Encounter: Payer: Self-pay | Admitting: Internal Medicine

## 2022-11-26 ENCOUNTER — Ambulatory Visit: Payer: BC Managed Care – PPO | Admitting: Internal Medicine

## 2022-11-26 VITALS — BP 125/72 | HR 99 | Temp 96.3°F | Ht 67.0 in | Wt >= 6400 oz

## 2022-11-26 DIAGNOSIS — Z6841 Body Mass Index (BMI) 40.0 and over, adult: Secondary | ICD-10-CM | POA: Insufficient documentation

## 2022-11-26 DIAGNOSIS — D500 Iron deficiency anemia secondary to blood loss (chronic): Secondary | ICD-10-CM | POA: Diagnosis not present

## 2022-11-26 DIAGNOSIS — R7303 Prediabetes: Secondary | ICD-10-CM

## 2022-11-26 DIAGNOSIS — F5101 Primary insomnia: Secondary | ICD-10-CM

## 2022-11-26 DIAGNOSIS — F32A Depression, unspecified: Secondary | ICD-10-CM

## 2022-11-26 DIAGNOSIS — Z8349 Family history of other endocrine, nutritional and metabolic diseases: Secondary | ICD-10-CM | POA: Diagnosis not present

## 2022-11-26 DIAGNOSIS — I10 Essential (primary) hypertension: Secondary | ICD-10-CM

## 2022-11-26 DIAGNOSIS — J4540 Moderate persistent asthma, uncomplicated: Secondary | ICD-10-CM

## 2022-11-26 DIAGNOSIS — G4733 Obstructive sleep apnea (adult) (pediatric): Secondary | ICD-10-CM

## 2022-11-26 DIAGNOSIS — Z23 Encounter for immunization: Secondary | ICD-10-CM

## 2022-11-26 DIAGNOSIS — F419 Anxiety disorder, unspecified: Secondary | ICD-10-CM

## 2022-11-26 NOTE — Assessment & Plan Note (Signed)
Encourage weight loss as this can help reduce sleep apnea symptoms Noncompliant with CPAP

## 2022-11-26 NOTE — Assessment & Plan Note (Signed)
Controlled off meds Reinforced DASH diet and exercise for weight loss

## 2022-11-26 NOTE — Patient Instructions (Signed)

## 2022-11-26 NOTE — Assessment & Plan Note (Signed)
Stable off meds Support offered

## 2022-11-26 NOTE — Assessment & Plan Note (Signed)
Continue albuterol as needed 

## 2022-11-26 NOTE — Assessment & Plan Note (Signed)
Encourage low-carb diet and exercise for weight loss 

## 2022-11-26 NOTE — Assessment & Plan Note (Signed)
Encourage diet and exercise for weight loss 

## 2022-11-26 NOTE — Assessment & Plan Note (Signed)
CBC and iron panel today 

## 2022-11-26 NOTE — Progress Notes (Signed)
HPI  Patient presents to clinic today to establish care and for management of the conditions listed below.  HTN: Her BP today is 125/72.  She is not currently taking any medications for this.  ECG from 09/2022 reviewed.  Asthma: Mild, intermittent.  She is using albuterol as needed.  PFTs from 02/2017 reviewed.  She follows with pulmonology.  Anxiety and Depression: She is not currently taking any medications for this.  She is not currently seeing a therapist.  She denies SI/HI.  Insomnia: She has difficulty falling and staying asleep.  She is not currently taking any medications for this.  Sleep study from 6/22 reviewed.  Iron Deficiency Anemia: Her last H/H was 7.9/27.8, 09/2022.  She is  not taking Iron as prescribed.  She follows with hematology.  Prediabetes: Her last A1c was 5.8%, 07/2022.  She is not taking any oral diabetic medication at this time.  She does not check her sugars.  OSA: She averages 5 hours of sleep per night without the use of her CPAP.  Sleep study from 03/2017 reviewed.  Past Medical History:  Diagnosis Date   Asthma    Borderline diabetes    Depression    Migraines    Obesity     Current Outpatient Medications  Medication Sig Dispense Refill   albuterol (VENTOLIN HFA) 108 (90 Base) MCG/ACT inhaler Inhale 2 puffs into the lungs every 4 (four) hours as needed for wheezing or shortness of breath. 8 g 0   EPINEPHrine 0.3 mg/0.3 mL IJ SOAJ injection Inject 0.3 mLs (0.3 mg total) into the muscle as needed for anaphylaxis. 1 each 1   Fe Fum-Vit C-Vit B12-FA (TRIGELS-F FORTE) CAPS capsule Take 1 capsule by mouth daily after breakfast. 90 capsule 0   guaiFENesin-dextromethorphan (ROBITUSSIN DM) 100-10 MG/5ML syrup Take 5 mLs by mouth every 4 (four) hours as needed for cough. 118 mL 0   ibuprofen (ADVIL) 200 MG tablet Take 200 mg by mouth every 6 (six) hours as needed for headache.     medroxyPROGESTERone (PROVERA) 10 MG tablet TAKE 1 TABLET BY MOUTH EVERY DAY 90  tablet 1   predniSONE (DELTASONE) 50 MG tablet Take 50 mg daily for the next 5 days 5 tablet 0   No current facility-administered medications for this visit.    Allergies  Allergen Reactions   Cashew Nut Oil Anaphylaxis   Citrullus Vulgaris Itching   Omnicef [Cefdinir] Hives and Other (See Comments)   Other Other (See Comments)    Allergic to walnuts, pecans and almonds per allergy test   Watermelon Flavor [Flavoring Agent] Other (See Comments)    Felt like her throat was itchy and closing    Family History  Problem Relation Age of Onset   Diabetes Mother    Vision loss Mother    Ovarian cancer Paternal Grandmother    Prostate cancer Paternal Grandfather     Social History   Socioeconomic History   Marital status: Single    Spouse name: Not on file   Number of children: Not on file   Years of education: Not on file   Highest education level: Not on file  Occupational History   Not on file  Tobacco Use   Smoking status: Never   Smokeless tobacco: Never  Vaping Use   Vaping Use: Never used  Substance and Sexual Activity   Alcohol use: No   Drug use: No   Sexual activity: Never    Birth control/protection: Other-see comments    Comment:  Provera  Other Topics Concern   Not on file  Social History Narrative   Not on file   Social Determinants of Health   Financial Resource Strain: Not on file  Food Insecurity: No Food Insecurity (10/15/2022)   Hunger Vital Sign    Worried About Running Out of Food in the Last Year: Never true    Ran Out of Food in the Last Year: Never true  Transportation Needs: No Transportation Needs (10/15/2022)   PRAPARE - Hydrologist (Medical): No    Lack of Transportation (Non-Medical): No  Physical Activity: Not on file  Stress: Not on file  Social Connections: Not on file  Intimate Partner Violence: Not At Risk (10/15/2022)   Humiliation, Afraid, Rape, and Kick questionnaire    Fear of Current or  Ex-Partner: No    Emotionally Abused: No    Physically Abused: No    Sexually Abused: No    ROS:  Constitutional: Patient reports intermittent headaches.  Denies fever, malaise, fatigue, or abrupt weight changes.  HEENT: Denies eye pain, eye redness, ear pain, ringing in the ears, wax buildup, runny nose, nasal congestion, bloody nose, or sore throat. Respiratory: Denies difficulty breathing, shortness of breath, cough or sputum production.   Cardiovascular: Denies chest pain, chest tightness, palpitations or swelling in the hands or feet.  Gastrointestinal: Denies abdominal pain, bloating, constipation, diarrhea or blood in the stool.  GU: Denies frequency, urgency, pain with urination, blood in urine, odor or discharge. Musculoskeletal: Denies decrease in range of motion, difficulty with gait, muscle pain or joint pain and swelling.  Skin: Denies redness, rashes, lesions or ulcercations.  Neurological: Pt reports insomnia. Denies dizziness, difficulty with memory, difficulty with speech or problems with balance and coordination.  Psych: Patient has a history of depression.  Denies anxiety, SI/HI.  No other specific complaints in a complete review of systems (except as listed in HPI above).  BP 125/72 (BP Location: Right Wrist, Patient Position: Sitting, Cuff Size: Normal)   Pulse 99   Temp (!) 96.3 F (35.7 C) (Temporal)   Ht '5\' 7"'$  (1.702 m)   Wt (!) 527 lb (239 kg)   SpO2 97%   BMI 82.54 kg/m   Wt Readings from Last 3 Encounters:  10/14/22 (!) 527 lb 9 oz (239.3 kg)  10/12/22 (!) 527 lb 9 oz (239.3 kg)  09/03/22 (!) 510 lb 6.4 oz (231.5 kg)    General: Appears her stated age, obese, in NAD. Skin: Excoriation noted to right arm. HEENT: Head: normal shape and size; Eyes: sclera white, no icterus, conjunctiva pink, PERRLA and EOMs intact;  Neck: Neck supple, trachea midline. No masses, lumps or thyromegaly present.  Cardiovascular: Normal rate and rhythm. S1,S2 noted.  No  murmur, rubs or gallops noted. No JVD or BLE edema.  Pulmonary/Chest: Normal effort and positive vesicular breath sounds. No respiratory distress. No wheezes, rales or ronchi noted.  Musculoskeletal:  No difficulty with gait.  Neurological: Alert and oriented. Coordination normal.  Psychiatric: Mood and affect normal. Behavior is normal. Judgment and thought content normal.    BMET    Component Value Date/Time   NA 137 10/15/2022 0426   K 4.3 10/15/2022 0426   CL 104 10/15/2022 0426   CO2 24 10/15/2022 0426   GLUCOSE 118 (H) 10/15/2022 0426   BUN 12 10/15/2022 0426   CREATININE 0.55 10/15/2022 0426   CALCIUM 8.6 (L) 10/15/2022 0426   GFRNONAA >60 10/15/2022 0426   GFRAA >60  05/31/2020 1938    Lipid Panel     Component Value Date/Time   CHOL 155 11/05/2018 1112   TRIG 47 11/05/2018 1112   HDL 54 11/05/2018 1112   CHOLHDL 2.9 11/05/2018 1112   VLDL 9 11/05/2018 1112   LDLCALC 92 11/05/2018 1112    CBC    Component Value Date/Time   WBC 8.5 10/16/2022 0425   RBC 3.36 (L) 10/16/2022 0425   HGB 7.9 (L) 10/16/2022 0425   HGB 9.1 (L) 09/22/2020 1505   HCT 27.8 (L) 10/16/2022 0425   HCT 29.4 (L) 09/22/2020 1505   PLT 270 10/16/2022 0425   PLT 325 09/22/2020 1505   MCV 82.7 10/16/2022 0425   MCV 78 (L) 09/22/2020 1505   MCH 23.5 (L) 10/16/2022 0425   MCHC 28.4 (L) 10/16/2022 0425   RDW 16.6 (H) 10/16/2022 0425   RDW 15.7 (H) 09/22/2020 1505   LYMPHSABS 0.7 10/14/2022 1513   MONOABS 0.5 10/14/2022 1513   EOSABS 0.0 10/14/2022 1513   BASOSABS 0.0 10/14/2022 1513    Hgb A1C Lab Results  Component Value Date   HGBA1C 5.8 (H) 07/25/2022     Assessment and Plan:   RTC in 6 months for your annual exam Webb Silversmith, NP

## 2022-11-27 LAB — IRON,TIBC AND FERRITIN PANEL
%SAT: 7 % (calc) — ABNORMAL LOW (ref 16–45)
Ferritin: 25 ng/mL (ref 16–154)
Iron: 22 ug/dL — ABNORMAL LOW (ref 40–190)
TIBC: 300 mcg/dL (calc) (ref 250–450)

## 2022-11-27 LAB — COMPLETE METABOLIC PANEL WITH GFR
AG Ratio: 1 (calc) (ref 1.0–2.5)
ALT: 18 U/L (ref 6–29)
AST: 15 U/L (ref 10–30)
Albumin: 3.6 g/dL (ref 3.6–5.1)
Alkaline phosphatase (APISO): 48 U/L (ref 31–125)
BUN: 11 mg/dL (ref 7–25)
CO2: 28 mmol/L (ref 20–32)
Calcium: 9.3 mg/dL (ref 8.6–10.2)
Chloride: 104 mmol/L (ref 98–110)
Creat: 0.64 mg/dL (ref 0.50–0.96)
Globulin: 3.7 g/dL (calc) (ref 1.9–3.7)
Glucose, Bld: 96 mg/dL (ref 65–99)
Potassium: 4.6 mmol/L (ref 3.5–5.3)
Sodium: 139 mmol/L (ref 135–146)
Total Bilirubin: 0.3 mg/dL (ref 0.2–1.2)
Total Protein: 7.3 g/dL (ref 6.1–8.1)
eGFR: 130 mL/min/{1.73_m2} (ref >=60)

## 2022-11-27 LAB — CBC
HCT: 31 % — ABNORMAL LOW (ref 35.0–45.0)
Hemoglobin: 9.6 g/dL — ABNORMAL LOW (ref 11.7–15.5)
MCH: 24.9 pg — ABNORMAL LOW (ref 27.0–33.0)
MCHC: 31 g/dL — ABNORMAL LOW (ref 32.0–36.0)
MCV: 80.3 fL (ref 80.0–100.0)
MPV: 10.6 fL (ref 7.5–12.5)
Platelets: 330 10*3/uL (ref 140–400)
RBC: 3.86 10*6/uL (ref 3.80–5.10)
RDW: 16.9 % — ABNORMAL HIGH (ref 11.0–15.0)
WBC: 9.1 10*3/uL (ref 3.8–10.8)

## 2022-11-27 LAB — LIPID PANEL
Cholesterol: 157 mg/dL (ref <200)
HDL: 44 mg/dL — ABNORMAL LOW (ref >=50)
LDL Cholesterol (Calc): 97 mg/dL (calc)
Non-HDL Cholesterol (Calc): 113 mg/dL (calc) (ref <130)
Total CHOL/HDL Ratio: 3.6 (calc) (ref <5.0)
Triglycerides: 75 mg/dL (ref <150)

## 2022-11-27 LAB — HEMOGLOBIN A1C
Hgb A1c MFr Bld: 5.8 % of total Hgb — ABNORMAL HIGH (ref <5.7)
Mean Plasma Glucose: 120 mg/dL
eAG (mmol/L): 6.6 mmol/L

## 2022-11-27 LAB — TSH: TSH: 2.54 mIU/L

## 2022-12-18 DIAGNOSIS — J9601 Acute respiratory failure with hypoxia: Secondary | ICD-10-CM | POA: Diagnosis not present

## 2023-01-16 DIAGNOSIS — J9601 Acute respiratory failure with hypoxia: Secondary | ICD-10-CM | POA: Diagnosis not present

## 2023-01-27 ENCOUNTER — Encounter: Payer: Self-pay | Admitting: Internal Medicine

## 2023-01-28 NOTE — Telephone Encounter (Signed)
She will need an appointment.  I saw her 2 months ago and she was not taking any medications for this.  Does she see psychiatry?

## 2023-02-04 ENCOUNTER — Ambulatory Visit: Payer: BC Managed Care – PPO | Admitting: Internal Medicine

## 2023-02-04 NOTE — Progress Notes (Deleted)
Subjective:    Patient ID: Sabrina Bell, female    DOB: [birth year], 22 y.o.   MRN: [id number]  HPI  Pt presents to the clinic today for follow up anxiety and depression.  She is currently not taking any medications for this but would like to get restarted on fluoxetine.  She is not currently seeing a therapist.  She denies SI/HI.  Review of Systems  Past Medical History:  Diagnosis Date   Asthma    Borderline diabetes    Depression    Migraines    Obesity     Current Outpatient Medications  Medication Sig Dispense Refill   albuterol (VENTOLIN HFA) 108 (90 Base) MCG/ACT inhaler Inhale 2 puffs into the lungs every 4 (four) hours as needed for wheezing or shortness of breath. 8 g 0   EPINEPHrine 0.3 mg/0.3 mL IJ SOAJ injection Inject 0.3 mLs (0.3 mg total) into the muscle as needed for anaphylaxis. 1 each 1   medroxyPROGESTERone (PROVERA) 10 MG tablet TAKE 1 TABLET BY MOUTH EVERY DAY 90 tablet 1   No current facility-administered medications for this visit.    Allergies  Allergen Reactions   Cashew Nut Oil Anaphylaxis   Citrullus Vulgaris Itching   Omnicef [Cefdinir] Hives and Other (See Comments)   Other Other (See Comments)    Allergic to walnuts, pecans and almonds per allergy test   Watermelon Flavor [Flavoring Agent] Other (See Comments)    Felt like her throat was itchy and closing    Family History  Problem Relation Age of Onset   Diabetes Mother    Vision loss Mother    Thyroid disease Father    Healthy Brother    Ovarian cancer Paternal Grandmother    Prostate cancer Paternal Grandfather     Social History   Socioeconomic History   Marital status: Single    Spouse name: Not on file   Number of children: Not on file   Years of education: Not on file   Highest education level: Not on file  Occupational History   Not on file  Tobacco Use   Smoking status: Never   Smokeless tobacco: Never  Vaping Use   Vaping Use: Never used  Substance and Sexual  Activity   Alcohol use: No   Drug use: No   Sexual activity: Never    Birth control/protection: Other-see comments    Comment: Provera  Other Topics Concern   Not on file  Social History Narrative   Not on file   Social Determinants of Health   Financial Resource Strain: Not on file  Food Insecurity: No Food Insecurity (10/15/2022)   Hunger Vital Sign    Worried About Running Out of Food in the Last Year: Never true    Ran Out of Food in the Last Year: Never true  Transportation Needs: No Transportation Needs (10/15/2022)   PRAPARE - Administrator, Civil Service (Medical): No    Lack of Transportation (Non-Medical): No  Physical Activity: Not on file  Stress: Not on file  Social Connections: Not on file  Intimate Partner Violence: Not At Risk (10/15/2022)   Humiliation, Afraid, Rape, and Kick questionnaire    Fear of Current or Ex-Partner: No    Emotionally Abused: No    Physically Abused: No    Sexually Abused: No     Constitutional: Denies fever, malaise, fatigue, headache or abrupt weight changes.  HEENT: Denies eye pain, eye redness, ear pain, ringing in the ears,  wax buildup, runny nose, nasal congestion, bloody nose, or sore throat. Respiratory: Denies difficulty breathing, shortness of breath, cough or sputum production.   Cardiovascular: Denies chest pain, chest tightness, palpitations or swelling in the hands or feet.  Gastrointestinal: Denies abdominal pain, bloating, constipation, diarrhea or blood in the stool.  GU: Denies urgency, frequency, pain with urination, burning sensation, blood in urine, odor or discharge. Musculoskeletal: Denies decrease in range of motion, difficulty with gait, muscle pain or joint pain and swelling.  Skin: Denies redness, rashes, lesions or ulcercations.  Neurological: Patient reports insomnia.  Denies dizziness, difficulty with memory, difficulty with speech or problems with balance and coordination.  Psych: Patient has  a history of anxiety and depression.  Denies SI/HI.  No other specific complaints in a complete review of systems (except as listed in HPI above).     Objective:   Physical Exam   There were no vitals taken for this visit. Wt Readings from Last 3 Encounters:  11/26/22 (!) 527 lb (239 kg)  10/14/22 (!) 527 lb 9 oz (239.3 kg)  10/12/22 (!) 527 lb 9 oz (239.3 kg)    General: Appears their stated age, well developed, well nourished in NAD. Skin: Warm, dry and intact. No rashes, lesions or ulcerations noted. HEENT: Head: normal shape and size; Eyes: sclera white, no icterus, conjunctiva pink, PERRLA and EOMs intact; Ears: Tm's gray and intact, normal light reflex; Nose: mucosa pink and moist, septum midline; Throat/Mouth: Teeth present, mucosa pink and moist, no exudate, lesions or ulcerations noted.  Neck:  Neck supple, trachea midline. No masses, lumps or thyromegaly present.  Cardiovascular: Normal rate and rhythm. S1,S2 noted.  No murmur, rubs or gallops noted. No JVD or BLE edema. No carotid bruits noted. Pulmonary/Chest: Normal effort and positive vesicular breath sounds. No respiratory distress. No wheezes, rales or ronchi noted.  Abdomen: Soft and nontender. Normal bowel sounds. No distention or masses noted. Liver, spleen and kidneys non palpable. Musculoskeletal: Normal range of motion. No signs of joint swelling. No difficulty with gait.  Neurological: Alert and oriented. Cranial nerves II-XII grossly intact. Coordination normal.  Psychiatric: Mood and affect normal. Behavior is normal. Judgment and thought content normal.    BMET    Component Value Date/Time   NA 139 11/26/2022 1023   K 4.6 11/26/2022 1023   CL 104 11/26/2022 1023   CO2 28 11/26/2022 1023   GLUCOSE 96 11/26/2022 1023   BUN 11 11/26/2022 1023   CREATININE 0.64 11/26/2022 1023   CALCIUM 9.3 11/26/2022 1023   GFRNONAA >60 10/15/2022 0426   GFRAA >60 05/31/2020 1938    Lipid Panel     Component Value  Date/Time   CHOL 157 11/26/2022 1023   TRIG 75 11/26/2022 1023   HDL 44 (L) 11/26/2022 1023   CHOLHDL 3.6 11/26/2022 1023   VLDL 9 11/05/2018 1112   LDLCALC 97 11/26/2022 1023    CBC    Component Value Date/Time   WBC 9.1 11/26/2022 1023   RBC 3.86 11/26/2022 1023   HGB 9.6 (L) 11/26/2022 1023   HGB 9.1 (L) 09/22/2020 1505   HCT 31.0 (L) 11/26/2022 1023   HCT 29.4 (L) 09/22/2020 1505   PLT 330 11/26/2022 1023   PLT 325 09/22/2020 1505   MCV 80.3 11/26/2022 1023   MCV 78 (L) 09/22/2020 1505   MCH 24.9 (L) 11/26/2022 1023   MCHC 31.0 (L) 11/26/2022 1023   RDW 16.9 (H) 11/26/2022 1023   RDW 15.7 (H) 09/22/2020 1505  LYMPHSABS 0.7 10/14/2022 1513   MONOABS 0.5 10/14/2022 1513   EOSABS 0.0 10/14/2022 1513   BASOSABS 0.0 10/14/2022 1513    Hgb A1C Lab Results  Component Value Date   HGBA1C 5.8 (H) 11/26/2022           Assessment & Plan:   RTC in 4 months for your annual exam Nicki Reaper, NP

## 2023-02-10 ENCOUNTER — Ambulatory Visit: Payer: BC Managed Care – PPO | Admitting: Internal Medicine

## 2023-02-16 DIAGNOSIS — J9601 Acute respiratory failure with hypoxia: Secondary | ICD-10-CM | POA: Diagnosis not present

## 2023-02-25 ENCOUNTER — Institutional Professional Consult (permissible substitution): Payer: BC Managed Care – PPO | Admitting: Nurse Practitioner

## 2023-03-18 DIAGNOSIS — J9601 Acute respiratory failure with hypoxia: Secondary | ICD-10-CM | POA: Diagnosis not present

## 2023-04-18 DIAGNOSIS — J9601 Acute respiratory failure with hypoxia: Secondary | ICD-10-CM | POA: Diagnosis not present

## 2023-05-18 DIAGNOSIS — J9601 Acute respiratory failure with hypoxia: Secondary | ICD-10-CM | POA: Diagnosis not present

## 2023-05-26 ENCOUNTER — Ambulatory Visit: Payer: BC Managed Care – PPO | Admitting: Internal Medicine

## 2023-05-26 NOTE — Progress Notes (Deleted)
Subjective:    Patient ID: Sabrina Bell, female    DOB: 2000/12/30, 22 y.o.   MRN: [id number]  HPI  Patient presents to clinic today for her annual exam.  She also reports that she is having swelling in her face and lower extremities.  She noticed this.  Flu: 09/2022 Tetanus: 11/2022 COVID: Never Pap smear: Dentist:  Diet:  Exercise:  Review of Systems  Past Medical History:  Diagnosis Date   Asthma    Borderline diabetes    Depression    Migraines    Obesity     Current Outpatient Medications  Medication Sig Dispense Refill   albuterol (VENTOLIN HFA) 108 (90 Base) MCG/ACT inhaler Inhale 2 puffs into the lungs every 4 (four) hours as needed for wheezing or shortness of breath. 8 g 0   EPINEPHrine 0.3 mg/0.3 mL IJ SOAJ injection Inject 0.3 mLs (0.3 mg total) into the muscle as needed for anaphylaxis. 1 each 1   medroxyPROGESTERone (PROVERA) 10 MG tablet TAKE 1 TABLET BY MOUTH EVERY DAY 90 tablet 1   No current facility-administered medications for this visit.    Allergies  Allergen Reactions   Cashew Nut Oil Anaphylaxis   Citrullus Vulgaris Itching   Omnicef [Cefdinir] Hives and Other (See Comments)   Other Other (See Comments)    Allergic to walnuts, pecans and almonds per allergy test   Watermelon Flavor [Flavoring Agent] Other (See Comments)    Felt like her throat was itchy and closing    Family History  Problem Relation Age of Onset   Diabetes Mother    Vision loss Mother    Thyroid disease Father    Healthy Brother    Ovarian cancer Paternal Grandmother    Prostate cancer Paternal Grandfather     Social History   Socioeconomic History   Marital status: Single    Spouse name: Not on file   Number of children: Not on file   Years of education: Not on file   Highest education level: Not on file  Occupational History   Not on file  Tobacco Use   Smoking status: Never   Smokeless tobacco: Never  Vaping Use   Vaping status: Never Used   Substance and Sexual Activity   Alcohol use: No   Drug use: No   Sexual activity: Never    Birth control/protection: Other-see comments    Comment: Provera  Other Topics Concern   Not on file  Social History Narrative   Not on file   Social Determinants of Health   Financial Resource Strain: Not on file  Food Insecurity: No Food Insecurity (10/15/2022)   Hunger Vital Sign    Worried About Running Out of Food in the Last Year: Never true    Ran Out of Food in the Last Year: Never true  Transportation Needs: No Transportation Needs (10/15/2022)   PRAPARE - Administrator, Civil Service (Medical): No    Lack of Transportation (Non-Medical): No  Physical Activity: Not on file  Stress: Not on file  Social Connections: Not on file  Intimate Partner Violence: Not At Risk (10/15/2022)   Humiliation, Afraid, Rape, and Kick questionnaire    Fear of Current or Ex-Partner: No    Emotionally Abused: No    Physically Abused: No    Sexually Abused: No     Constitutional: Denies fever, malaise, fatigue, headache or abrupt weight changes.  HEENT: Denies eye pain, eye redness, ear pain, ringing in the ears,  wax buildup, runny nose, nasal congestion, bloody nose, or sore throat. Respiratory: Denies difficulty breathing, shortness of breath, cough or sputum production.   Cardiovascular: Patient reports swelling in face and lower extremities.  Denies chest pain, chest tightness, palpitations or swelling in the hands.  Gastrointestinal: Denies abdominal pain, bloating, constipation, diarrhea or blood in the stool.  GU: Denies urgency, frequency, pain with urination, burning sensation, blood in urine, odor or discharge. Musculoskeletal: Denies decrease in range of motion, difficulty with gait, muscle pain or joint pain and swelling.  Skin: Denies redness, rashes, lesions or ulcercations.  Neurological: Patient reports insomnia.  Denies dizziness, difficulty with memory, difficulty  with speech or problems with balance and coordination.  Psych: Patient has a history of anxiety and depression.  Denies SI/HI.  No other specific complaints in a complete review of systems (except as listed in HPI above).     Objective:   Physical Exam   There were no vitals taken for this visit. Wt Readings from Last 3 Encounters:  11/26/22 (!) 527 lb (239 kg)  10/14/22 (!) 527 lb 9 oz (239.3 kg)  10/12/22 (!) 527 lb 9 oz (239.3 kg)    General: Appears their stated age, well developed, well nourished in NAD. Skin: Warm, dry and intact. No rashes, lesions or ulcerations noted. HEENT: Head: normal shape and size; Eyes: sclera white, no icterus, conjunctiva pink, PERRLA and EOMs intact; Ears: Tm's gray and intact, normal light reflex; Nose: mucosa pink and moist, septum midline; Throat/Mouth: Teeth present, mucosa pink and moist, no exudate, lesions or ulcerations noted.  Neck:  Neck supple, trachea midline. No masses, lumps or thyromegaly present.  Cardiovascular: Normal rate and rhythm. S1,S2 noted.  No murmur, rubs or gallops noted. No JVD or BLE edema. No carotid bruits noted. Pulmonary/Chest: Normal effort and positive vesicular breath sounds. No respiratory distress. No wheezes, rales or ronchi noted.  Abdomen: Soft and nontender. Normal bowel sounds. No distention or masses noted. Liver, spleen and kidneys non palpable. Musculoskeletal: Normal range of motion. No signs of joint swelling. No difficulty with gait.  Neurological: Alert and oriented. Cranial nerves II-XII grossly intact. Coordination normal.  Psychiatric: Mood and affect normal. Behavior is normal. Judgment and thought content normal.    BMET    Component Value Date/Time   NA 139 11/26/2022 1023   K 4.6 11/26/2022 1023   CL 104 11/26/2022 1023   CO2 28 11/26/2022 1023   GLUCOSE 96 11/26/2022 1023   BUN 11 11/26/2022 1023   CREATININE 0.64 11/26/2022 1023   CALCIUM 9.3 11/26/2022 1023   GFRNONAA >60 10/15/2022  0426   GFRAA >60 05/31/2020 1938    Lipid Panel     Component Value Date/Time   CHOL 157 11/26/2022 1023   TRIG 75 11/26/2022 1023   HDL 44 (L) 11/26/2022 1023   CHOLHDL 3.6 11/26/2022 1023   VLDL 9 11/05/2018 1112   LDLCALC 97 11/26/2022 1023    CBC    Component Value Date/Time   WBC 9.1 11/26/2022 1023   RBC 3.86 11/26/2022 1023   HGB 9.6 (L) 11/26/2022 1023   HGB 9.1 (L) 09/22/2020 1505   HCT 31.0 (L) 11/26/2022 1023   HCT 29.4 (L) 09/22/2020 1505   PLT 330 11/26/2022 1023   PLT 325 09/22/2020 1505   MCV 80.3 11/26/2022 1023   MCV 78 (L) 09/22/2020 1505   MCH 24.9 (L) 11/26/2022 1023   MCHC 31.0 (L) 11/26/2022 1023   RDW 16.9 (H) 11/26/2022 1023  RDW 15.7 (H) 09/22/2020 1505   LYMPHSABS 0.7 10/14/2022 1513   MONOABS 0.5 10/14/2022 1513   EOSABS 0.0 10/14/2022 1513   BASOSABS 0.0 10/14/2022 1513    Hgb A1C Lab Results  Component Value Date   HGBA1C 5.8 (H) 11/26/2022           Assessment & Plan:   Preventative health maintenance:  Encouraged her to get a flu shot in the fall Tetanus UTD Encouraged her to get her COVID-vaccine Referral to GYN for Pap smear Encouraged to consume a balanced diet and exercise regimen  Advised her to see a dentist annually We will check CBC, c-Met, lipid, A1c and hep C today  RTC in 6 months, follow-up chronic conditions Nicki Reaper, NP

## 2023-06-03 ENCOUNTER — Ambulatory Visit: Payer: BC Managed Care – PPO | Admitting: Internal Medicine

## 2023-06-05 ENCOUNTER — Ambulatory Visit: Payer: BC Managed Care – PPO | Admitting: Internal Medicine

## 2023-06-05 NOTE — Progress Notes (Deleted)
Subjective:    Patient ID: Sabrina Bell, female    DOB: [birth year], 22 y.o.   MRN: [id number]  HPI  Patient presents to clinic today for her annual exam.  Flu: 09/2022 Tetanus: 11/2022 COVID: Never Pap smear: Never Dentist:  Diet: Exercise:  Review of Systems     Past Medical History:  Diagnosis Date   Asthma    Borderline diabetes    Depression    Migraines    Obesity     Current Outpatient Medications  Medication Sig Dispense Refill   albuterol (VENTOLIN HFA) 108 (90 Base) MCG/ACT inhaler Inhale 2 puffs into the lungs every 4 (four) hours as needed for wheezing or shortness of breath. 8 g 0   EPINEPHrine 0.3 mg/0.3 mL IJ SOAJ injection Inject 0.3 mLs (0.3 mg total) into the muscle as needed for anaphylaxis. 1 each 1   medroxyPROGESTERone (PROVERA) 10 MG tablet TAKE 1 TABLET BY MOUTH EVERY DAY 90 tablet 1   No current facility-administered medications for this visit.    Allergies  Allergen Reactions   Cashew Nut Oil Anaphylaxis   Citrullus Vulgaris Itching   Omnicef [Cefdinir] Hives and Other (See Comments)   Other Other (See Comments)    Allergic to walnuts, pecans and almonds per allergy test   Watermelon Flavor [Flavoring Agent] Other (See Comments)    Felt like her throat was itchy and closing    Family History  Problem Relation Age of Onset   Diabetes Mother    Vision loss Mother    Thyroid disease Father    Healthy Brother    Ovarian cancer Paternal Grandmother    Prostate cancer Paternal Grandfather     Social History   Socioeconomic History   Marital status: Single    Spouse name: Not on file   Number of children: Not on file   Years of education: Not on file   Highest education level: 9th grade  Occupational History   Not on file  Tobacco Use   Smoking status: Never   Smokeless tobacco: Never  Vaping Use   Vaping status: Never Used  Substance and Sexual Activity   Alcohol use: No   Drug use: No   Sexual activity: Never     Birth control/protection: Other-see comments    Comment: Provera  Other Topics Concern   Not on file  Social History Narrative   Not on file   Social Determinants of Health   Financial Resource Strain: Patient Declined (06/01/2023)   Overall Financial Resource Strain (CARDIA)    Difficulty of Paying Living Expenses: Patient declined  Food Insecurity: Patient Declined (06/01/2023)   Hunger Vital Sign    Worried About Running Out of Food in the Last Year: Patient declined    Ran Out of Food in the Last Year: Patient declined  Transportation Needs: Patient Declined (06/01/2023)   PRAPARE - Transportation    Lack of Transportation (Medical): Patient declined    Lack of Transportation (Non-Medical): Patient declined  Physical Activity: Sufficiently Active (06/01/2023)   Exercise Vital Sign    Days of Exercise per Week: 4 days    Minutes of Exercise per Session: 90 min  Stress: Stress Concern Present (06/01/2023)   [employer] of Occupational Health - Occupational Stress Questionnaire    Feeling of Stress : Very much  Social Connections: Moderately Isolated (06/01/2023)   Social Connection and Isolation Panel [NHANES]    Frequency of Communication with Friends and Family: Three times a week  Frequency of Social Gatherings with Friends and Family: More than three times a week    Attends Religious Services: 1 to 4 times per year    Active Member of [employer] or Organizations: No    Attends Engineer, structural: Not on file    Marital Status: Never married  Intimate Partner Violence: Not At Risk (10/15/2022)   Humiliation, Afraid, Rape, and Kick questionnaire    Fear of Current or Ex-Partner: No    Emotionally Abused: No    Physically Abused: No    Sexually Abused: No     Constitutional: Denies fever, malaise, fatigue, headache or abrupt weight changes.  HEENT: Denies eye pain, eye redness, ear pain, ringing in the ears, wax buildup, runny nose, nasal congestion, bloody  nose, or sore throat. Respiratory: Denies difficulty breathing, shortness of breath, cough or sputum production.   Cardiovascular: Patient reports swelling in legs.  Denies chest pain, chest tightness, palpitations or swelling in the hands.  Gastrointestinal: Denies abdominal pain, bloating, constipation, diarrhea or blood in the stool.  GU: Denies urgency, frequency, pain with urination, burning sensation, blood in urine, odor or discharge. Musculoskeletal: Denies decrease in range of motion, difficulty with gait, muscle pain or joint pain and swelling.  Skin: Denies redness, rashes, lesions or ulcercations.  Neurological: Patient reports insomnia.  Denies dizziness, difficulty with memory, difficulty with speech or problems with balance and coordination.  Psych: Patient has a history of anxiety and depression.  Denies SI/HI.  No other specific complaints in a complete review of systems (except as listed in HPI above).  Objective:   Physical Exam  There were no vitals taken for this visit. Wt Readings from Last 3 Encounters:  11/26/22 (!) 527 lb (239 kg)  10/14/22 (!) 527 lb 9 oz (239.3 kg)  10/12/22 (!) 527 lb 9 oz (239.3 kg)    General: Appears their stated age, well developed, well nourished in NAD. Skin: Warm, dry and intact. No rashes, lesions or ulcerations noted. HEENT: Head: normal shape and size; Eyes: sclera white, no icterus, conjunctiva pink, PERRLA and EOMs intact; Ears: Tm's gray and intact, normal light reflex; Nose: mucosa pink and moist, septum midline; Throat/Mouth: Teeth present, mucosa pink and moist, no exudate, lesions or ulcerations noted.  Neck:  Neck supple, trachea midline. No masses, lumps or thyromegaly present.  Cardiovascular: Normal rate and rhythm. S1,S2 noted.  No murmur, rubs or gallops noted. No JVD or BLE edema. No carotid bruits noted. Pulmonary/Chest: Normal effort and positive vesicular breath sounds. No respiratory distress. No wheezes, rales or  ronchi noted.  Abdomen: Soft and nontender. Normal bowel sounds. No distention or masses noted. Liver, spleen and kidneys non palpable. Musculoskeletal: Normal range of motion. No signs of joint swelling. No difficulty with gait.  Neurological: Alert and oriented. Cranial nerves II-XII grossly intact. Coordination normal.  Psychiatric: Mood and affect normal. Behavior is normal. Judgment and thought content normal.     BMET    Component Value Date/Time   NA 139 11/26/2022 1023   K 4.6 11/26/2022 1023   CL 104 11/26/2022 1023   CO2 28 11/26/2022 1023   GLUCOSE 96 11/26/2022 1023   BUN 11 11/26/2022 1023   CREATININE 0.64 11/26/2022 1023   CALCIUM 9.3 11/26/2022 1023   GFRNONAA >60 10/15/2022 0426   GFRAA >60 05/31/2020 1938    Lipid Panel     Component Value Date/Time   CHOL 157 11/26/2022 1023   TRIG 75 11/26/2022 1023   HDL 44 (  L) 11/26/2022 1023   CHOLHDL 3.6 11/26/2022 1023   VLDL 9 11/05/2018 1112   LDLCALC 97 11/26/2022 1023    CBC    Component Value Date/Time   WBC 9.1 11/26/2022 1023   RBC 3.86 11/26/2022 1023   HGB 9.6 (L) 11/26/2022 1023   HGB 9.1 (L) 09/22/2020 1505   HCT 31.0 (L) 11/26/2022 1023   HCT 29.4 (L) 09/22/2020 1505   PLT 330 11/26/2022 1023   PLT 325 09/22/2020 1505   MCV 80.3 11/26/2022 1023   MCV 78 (L) 09/22/2020 1505   MCH 24.9 (L) 11/26/2022 1023   MCHC 31.0 (L) 11/26/2022 1023   RDW 16.9 (H) 11/26/2022 1023   RDW 15.7 (H) 09/22/2020 1505   LYMPHSABS 0.7 10/14/2022 1513   MONOABS 0.5 10/14/2022 1513   EOSABS 0.0 10/14/2022 1513   BASOSABS 0.0 10/14/2022 1513    Hgb A1C Lab Results  Component Value Date   HGBA1C 5.8 (H) 11/26/2022            Assessment & Plan:   Preventative health maintenance:  Encouraged her to get a flu shot in the fall Tetanus UTD Encouraged her to get her COVID-vaccine Referral to GYN for Pap smear Encouraged her to consume a balanced diet and exercise regimen Advised her to see a dentist  annually We will check CBC, c-Met, lipid, A1c today  RTC in 6 months, follow-up chronic conditions Nicki Reaper, NP

## 2023-06-18 DIAGNOSIS — J9601 Acute respiratory failure with hypoxia: Secondary | ICD-10-CM | POA: Diagnosis not present

## 2023-07-15 DIAGNOSIS — N898 Other specified noninflammatory disorders of vagina: Secondary | ICD-10-CM | POA: Diagnosis not present

## 2023-07-15 DIAGNOSIS — R350 Frequency of micturition: Secondary | ICD-10-CM | POA: Diagnosis not present

## 2023-07-15 DIAGNOSIS — N760 Acute vaginitis: Secondary | ICD-10-CM | POA: Diagnosis not present

## 2023-07-15 DIAGNOSIS — Z3202 Encounter for pregnancy test, result negative: Secondary | ICD-10-CM | POA: Diagnosis not present

## 2023-07-19 ENCOUNTER — Other Ambulatory Visit: Payer: Self-pay

## 2023-07-19 ENCOUNTER — Emergency Department: Payer: BC Managed Care – PPO

## 2023-07-19 ENCOUNTER — Emergency Department
Admission: EM | Admit: 2023-07-19 | Discharge: 2023-07-19 | Disposition: A | Discharge status: Home / Self Care | Payer: BC Managed Care – PPO | Attending: Emergency Medicine | Admitting: Emergency Medicine

## 2023-07-19 DIAGNOSIS — J9601 Acute respiratory failure with hypoxia: Secondary | ICD-10-CM | POA: Diagnosis not present

## 2023-07-19 DIAGNOSIS — J45909 Unspecified asthma, uncomplicated: Secondary | ICD-10-CM | POA: Diagnosis not present

## 2023-07-19 DIAGNOSIS — D649 Anemia, unspecified: Secondary | ICD-10-CM | POA: Insufficient documentation

## 2023-07-19 DIAGNOSIS — I10 Essential (primary) hypertension: Secondary | ICD-10-CM | POA: Diagnosis not present

## 2023-07-19 DIAGNOSIS — R0789 Other chest pain: Secondary | ICD-10-CM | POA: Diagnosis not present

## 2023-07-19 DIAGNOSIS — R4702 Dysphasia: Secondary | ICD-10-CM | POA: Insufficient documentation

## 2023-07-19 DIAGNOSIS — R079 Chest pain, unspecified: Secondary | ICD-10-CM | POA: Diagnosis not present

## 2023-07-19 LAB — CBC
HCT: 35.3 % — ABNORMAL LOW (ref 36.0–46.0)
Hemoglobin: 9.4 g/dL — ABNORMAL LOW (ref 12.0–15.0)
MCH: 23.7 pg — ABNORMAL LOW (ref 26.0–34.0)
MCHC: 26.6 g/dL — ABNORMAL LOW (ref 30.0–36.0)
MCV: 89.1 fL (ref 80.0–100.0)
Platelets: 251 10*3/uL (ref 150–400)
RBC: 3.96 MIL/uL (ref 3.87–5.11)
RDW: 16.2 % — ABNORMAL HIGH (ref 11.5–15.5)
WBC: 7 10*3/uL (ref 4.0–10.5)
nRBC: 0 % (ref 0.0–0.2)

## 2023-07-19 LAB — BASIC METABOLIC PANEL WITH GFR
Anion gap: 10 (ref 5–15)
BUN: 10 mg/dL (ref 6–20)
CO2: 23 mmol/L (ref 22–32)
Calcium: 8.6 mg/dL — ABNORMAL LOW (ref 8.9–10.3)
Chloride: 106 mmol/L (ref 98–111)
Creatinine, Ser: 0.74 mg/dL (ref 0.44–1.00)
GFR, Estimated: >60 mL/min
Glucose, Bld: 98 mg/dL (ref 70–99)
Potassium: 3.8 mmol/L (ref 3.5–5.1)
Sodium: 139 mmol/L (ref 135–145)

## 2023-07-19 LAB — TROPONIN I (HIGH SENSITIVITY): Troponin I (High Sensitivity): <2 ng/L

## 2023-07-19 MED ORDER — FAMOTIDINE 20 MG PO TABS: 20.0000 mg | ORAL_TABLET | Freq: Two times a day (BID) | ORAL | 1 refills | Status: DC

## 2023-07-19 NOTE — ED Provider Notes (Signed)
[facility] Provider Note    Event Date/Time   First MD Initiated Contact with Patient 07/19/23 1511     (approximate)   History   Chest Pain   HPI  Sabrina Bell is a 22 y.o. female  with PMH of asthma, anxiety, depression, sleep apnea, hypertension and morbid obesity who presents for evaluation of chest pain.  Patient states that she has had chest pain since the beginning of the month, she describes it as chest tightness.  She denies any difficulty breathing and shortness of breath.  She has a states that she has been very congested and notes that she has had some difficulty swallowing.  She describes feeling like things are getting stuck in her throat.      Physical Exam   Triage Vital Signs: ED Triage Vitals  Encounter Vitals Group     BP 07/19/23 1454 137/62     Systolic BP Percentile --      Diastolic BP Percentile --      Pulse Rate 07/19/23 1454 96     Resp 07/19/23 1454 (!) 22     Temp 07/19/23 1454 98.6 F (37 C)     Temp Source 07/19/23 1454 Oral     SpO2 07/19/23 1454 99 %     Weight --      Height 07/19/23 1452 5\' 7"  (1.702 m)     Head Circumference --      Peak Flow --      Pain Score 07/19/23 1452 6     Pain Loc --      Pain Education --      Exclude from Growth Chart --     Most recent vital signs: Vitals:   07/19/23 1454  BP: 137/62  Pulse: 96  Resp: (!) 22  Temp: 98.6 F (37 C)  SpO2: 99%    General: Awake, no distress.  CV:  Good peripheral perfusion.  RRR. Resp:  Normal effort.  CTAB. Abd:  No distention.    ED Results / Procedures / Treatments   Labs (all labs ordered are listed, but only abnormal results are displayed) Labs Reviewed  BASIC METABOLIC PANEL - Abnormal; Notable for the following components:      Result Value   Calcium 8.6 (*)    All other components within normal limits  CBC - Abnormal; Notable for the following components:   Hemoglobin 9.4 (*)    HCT 35.3 (*)    MCH 23.7 (*)     MCHC 26.6 (*)    RDW 16.2 (*)    All other components within normal limits  RESP PANEL BY RT-PCR (RSV, FLU A&B, COVID)  RVPGX2  POC URINE PREG, ED  TROPONIN I (HIGH SENSITIVITY)  TROPONIN I (HIGH SENSITIVITY)     EKG  EKG as interpreted by me: Normal sinus rhythm with no ST changes, normal axis.  Vent. rate 94 BPM PR interval 166 ms QRS duration 86 ms QT/QTcB 360/450 ms P-R-T axes 22 13 29   RADIOLOGY  Chest x-ray obtained, interpreted the images as well as reviewed the radiologist report which is negative for any acute cardiopulmonary abnormalities.   PROCEDURES:  Critical Care performed: No  Procedures   MEDICATIONS ORDERED IN ED: Medications - No data to display   IMPRESSION / MDM / ASSESSMENT AND PLAN / ED COURSE  I reviewed the triage vital signs and the nursing notes.  22 year old female presents for evaluation of chest pain, patient was tachypneic in triage otherwise vital signs stable.  Differential diagnosis includes, but is not limited to, ACS, aortic dissection, pulmonary embolism, cardiac tamponade, pneumothorax, pneumonia, pericarditis, myocarditis, GI-related causes including esophagitis/gastritis, and musculoskeletal chest wall pain.    Patient's presentation is most consistent with acute presentation with potential threat to life or bodily function.  BMP unremarkable, CBC showed anemia.  Troponin was not elevated.   Respiratory panel completed at patient's request for her congestion, there was an issue with it at the lab so she needed to be reswabbed.  Patient did not want to wait for her results, advised she can follow this up on MyChart.  Chest x-ray obtained, interpreted the images as well as reviewed the radiologist report which did not show any acute cardiopulmonary abnormalities.  EKG showed normal sinus rhythm with no ST changes and normal axis.  Patient's workup is very reassuring and I feel she stable for  outpatient management.  I advised patient to follow-up with her primary care provider.  I also gave her some instructions on what allergy medications to take.  I will send some famotidine as patient reported some difficulty swallowing.  This combined with her ongoing chest pain I think it may be coming from GERD.  I explained the patient can also take antacids.  She voiced understanding, all questions were answered and she was stable at discharge.   FINAL CLINICAL IMPRESSION(S) / ED DIAGNOSES   Final diagnoses:  Nonspecific chest pain     Rx / DC Orders   ED Discharge Orders          Ordered    famotidine (PEPCID) 20 MG tablet  2 times daily        07/19/23 1834             Note:  This document was prepared using Dragon voice recognition software and may include unintentional dictation errors.   Cameron Ali, PA-C 07/19/23 1847    Janith Lima, MD 07/19/23 [phone]

## 2023-07-19 NOTE — Discharge Instructions (Signed)
Your blood work, chest x-ray and EKG were reassuring today.  Please check MyChart for the results of your respiratory panel.  Continue to take your daily allergy medication.  You can also consider adding a nasal spray like Flonase which contains fluticasone.  This is the ingredient you are to look for if you would like to buy something generic.  I have sent some medication to decrease acid production which may be what is causing your chest pain and difficulty swallowing.

## 2023-07-19 NOTE — ED Triage Notes (Signed)
Pt ambulatory to ER for centralized CP. 6/10 pain. No nausea, no radiation, does say there is new back pain.

## 2023-08-18 DIAGNOSIS — J9601 Acute respiratory failure with hypoxia: Secondary | ICD-10-CM | POA: Diagnosis not present

## 2023-08-22 ENCOUNTER — Telehealth: Payer: Self-pay

## 2023-08-22 NOTE — Telephone Encounter (Signed)
Transition Care Management Unsuccessful Follow-up Telephone Call  Date of discharge and from where:  07/19/2023 [facility].  Attempts:  1st Attempt  Reason for unsuccessful TCM follow-up call:  No answer/busy  Jamelle Noy [facility]  [facility], [facility] Guide Direct Dial: [phone]  Website: [email or web address]

## 2023-08-22 NOTE — Telephone Encounter (Signed)
Transition Care Management Follow-up Telephone Call Date of discharge and from where: 07/19/2023 [facility] How have you been since you were released from the hospital? Patient is feeling much better. Any questions or concerns? No  Items Reviewed: Did the pt receive and understand the discharge instructions provided? Yes  Medications obtained and verified? Yes  Other? No  Any new allergies since your discharge? No  Dietary orders reviewed? Yes Do you have support at home? Yes   Follow up appointments reviewed:  PCP Hospital f/u appt confirmed? No  Scheduled to see  on  @ . Specialist Hospital f/u appt confirmed? No  Scheduled to see  on  @ . Are transportation arrangements needed? No  If their condition worsens, is the pt aware to call PCP or go to the Emergency Dept.? Yes Was the patient provided with contact information for the PCP's office or ED? Yes Was to pt encouraged to call back with questions or concerns? Yes   Sabrina Bell [facility]  [facility], [facility] Guide Direct Dial: [phone]  Website: [email or web address]

## 2023-08-24 DIAGNOSIS — M545 Low back pain, unspecified: Secondary | ICD-10-CM | POA: Insufficient documentation

## 2023-08-24 DIAGNOSIS — J45909 Unspecified asthma, uncomplicated: Secondary | ICD-10-CM | POA: Insufficient documentation

## 2023-08-24 NOTE — ED Triage Notes (Signed)
Pt arrived POV for lower back pain for over a month, pain is worse with movement, worse with ambulation, and pain also increase when sitting for long periods. Pt reports pain improves while laying down. Denies any known injuries. Pt denies any urinary symptoms. A&O x4.

## 2023-08-25 ENCOUNTER — Emergency Department: Payer: BC Managed Care – PPO

## 2023-08-25 ENCOUNTER — Emergency Department
Admission: EM | Admit: 2023-08-25 | Discharge: 2023-08-25 | Disposition: A | Discharge status: Home / Self Care | Payer: BC Managed Care – PPO | Attending: Emergency Medicine | Admitting: Emergency Medicine

## 2023-08-25 DIAGNOSIS — M545 Low back pain, unspecified: Secondary | ICD-10-CM | POA: Diagnosis not present

## 2023-08-25 LAB — POC URINE PREG, ED: Preg Test, Ur: NEGATIVE

## 2023-08-25 MED ORDER — LIDOCAINE 5 % EX PTCH
1.0000 | MEDICATED_PATCH | CUTANEOUS | Status: DC
  Administered 2023-08-25: 1 via TRANSDERMAL
  Filled 2023-08-25: qty 1

## 2023-08-25 MED ORDER — METHOCARBAMOL 500 MG PO TABS: 500.0000 mg | ORAL_TABLET | Freq: Three times a day (TID) | ORAL | 0 refills | Status: DC | PRN

## 2023-08-25 MED ORDER — IBUPROFEN 800 MG PO TABS
800.0000 mg | ORAL_TABLET | Freq: Once | ORAL | Status: AC
  Administered 2023-08-25: 800 mg via ORAL
  Filled 2023-08-25: qty 1

## 2023-08-25 MED ORDER — METHOCARBAMOL 500 MG PO TABS
1000.0000 mg | ORAL_TABLET | Freq: Once | ORAL | Status: AC
  Administered 2023-08-25: 1000 mg via ORAL
  Filled 2023-08-25: qty 2

## 2023-08-25 MED ORDER — IBUPROFEN 800 MG PO TABS: 800.0000 mg | ORAL_TABLET | Freq: Three times a day (TID) | ORAL | 0 refills | Status: AC | PRN

## 2023-08-25 MED ORDER — LIDOCAINE 5 % EX PTCH: 1.0000 | MEDICATED_PATCH | Freq: Two times a day (BID) | CUTANEOUS | 0 refills | Status: AC

## 2023-08-25 NOTE — Discharge Instructions (Signed)
You may alternate Tylenol 1000 mg every 6 hours as needed for pain, fever and Ibuprofen 800 mg every 6-8 hours as needed for pain, fever.  Please take Ibuprofen with food.  Do not take more than 4000 mg of Tylenol (acetaminophen) in a 24 hour period. ° °

## 2023-08-25 NOTE — ED Notes (Addendum)
Urine sent to lab for a urine specimen if needed at a later time, POC urine preg completed

## 2023-08-25 NOTE — ED Provider Notes (Signed)
[facility] Provider Note    Event Date/Time   First MD Initiated Contact with Patient 08/25/23 0118     (approximate)   History   Back Pain   HPI  Sabrina Bell is a 22 y.o. female with history of morbid obesity, migraines, depression, asthma who presents to the emergency department with 1 month of lower back pain worse on the left side.  Denies any injury.  No numbness, tingling, weakness, bowel or bladder incontinence, fever.  Has been taking ibuprofen without relief.  Pain worse with movement, walking but patient is able to ambulate.  She does not have a PCP.  Denies dysuria, hematuria.   History provided by patient, mother.    Past Medical History:  Diagnosis Date   Asthma    Borderline diabetes    Depression    Migraines    Obesity     Past Surgical History:  Procedure Laterality Date   NO PAST SURGERIES      MEDICATIONS:  Prior to Admission medications   Medication Sig Start Date End Date Taking? Authorizing Provider  ibuprofen (ADVIL) 800 MG tablet Take 1 tablet (800 mg total) by mouth every 8 (eight) hours as needed. 08/25/23  Yes Brydon Spahr N, DO  lidocaine (LIDODERM) 5 % Place 1 patch onto the skin every 12 (twelve) hours. Remove & Discard patch within 12 hours or as directed by MD 08/25/23 08/24/24 Yes Aura Bibby, Layla Maw, DO  methocarbamol (ROBAXIN) 500 MG tablet Take 1 tablet (500 mg total) by mouth every 8 (eight) hours as needed for muscle spasms. 08/25/23  Yes Miguel Christiana, Baxter Hire N, DO  albuterol (VENTOLIN HFA) 108 (90 Base) MCG/ACT inhaler Inhale 2 puffs into the lungs every 4 (four) hours as needed for wheezing or shortness of breath. 06/08/22   Shaune Pollack, MD  EPINEPHrine 0.3 mg/0.3 mL IJ SOAJ injection Inject 0.3 mLs (0.3 mg total) into the muscle as needed for anaphylaxis. 04/28/20   Nita Sickle, MD  famotidine (PEPCID) 20 MG tablet Take 1 tablet (20 mg total) by mouth 2 (two) times daily. 07/19/23 07/18/24  Cameron Ali, PA-C  medroxyPROGESTERone (PROVERA) 10 MG tablet TAKE 1 TABLET BY MOUTH EVERY DAY 10/07/22   Linzie Collin, MD  fluticasone (FLOVENT HFA) 110 MCG/ACT inhaler Inhale 2 puffs into the lungs 2 (two) times daily. 11/06/18 05/30/20  Melene Plan, MD    Physical Exam   Triage Vital Signs: ED Triage Vitals  Encounter Vitals Group     BP 08/24/23 2316 (!) 151/58     Systolic BP Percentile --      Diastolic BP Percentile --      Pulse Rate 08/24/23 2316 90     Resp 08/24/23 2316 (!) 21     Temp 08/24/23 2316 98.2 F (36.8 C)     Temp src --      SpO2 08/24/23 2316 96 %     Weight 08/24/23 2330 (!) 548 lb (248.6 kg)     Height 08/24/23 2330 5\' 7"  (1.702 m)     Head Circumference --      Peak Flow --      Pain Score 08/24/23 2330 7     Pain Loc --      Pain Education --      Exclude from Growth Chart --     Most recent vital signs: Vitals:   08/24/23 2316  BP: (!) 151/58  Pulse: 90  Resp: (!) 21  Temp:  98.2 F (36.8 C)  SpO2: 96%    CONSTITUTIONAL: Alert, responds appropriately to questions.  Morbidly obese. HEAD: Normocephalic, atraumatic EYES: Conjunctivae clear, pupils appear equal, sclera nonicteric ENT: normal nose; moist mucous membranes NECK: Supple, normal ROM CARD: RRR; S1 and S2 appreciated RESP: Normal chest excursion without splinting or tachypnea; breath sounds clear and equal bilaterally; no wheezes, no rhonchi, no rales, no hypoxia or respiratory distress, speaking full sentences ABD/GI: Non-distended; soft, non-tender, no rebound, no guarding, no peritoneal signs BACK: Tender to palpation over the left lumbar paraspinal muscles without overlying skin changes.  No midline spinal tenderness, step-off or deformity. EXT: Normal ROM in all joints; no deformity noted, no edema SKIN: Normal color for age and race; warm; no rash on exposed skin NEURO: Moves all extremities equally, normal speech, reports normal sensation, no saddle anesthesia, unable to reliably  test reflexes due to to body habitus, no clonus PSYCH: The patient's mood and manner are appropriate.   ED Results / Procedures / Treatments   LABS: (all labs ordered are listed, but only abnormal results are displayed) Labs Reviewed  POC URINE PREG, ED     EKG:  EKG Interpretation Date/Time:    Ventricular Rate:    PR Interval:    QRS Duration:    QT Interval:    QTC Calculation:   R Axis:      Text Interpretation:           RADIOLOGY: My personal review and interpretation of imaging: X-ray shows no acute abnormality.  I have personally reviewed all radiology reports.   DG Lumbar Spine 2-3 Views  Result Date: 08/25/2023 CLINICAL DATA:  Low back pain EXAM: LUMBAR SPINE - 2-3 VIEW COMPARISON:  None Available. FINDINGS: There is no evidence of lumbar spine fracture. Alignment is normal. Intervertebral disc spaces are maintained. IMPRESSION: Negative. Electronically Signed   By: Charlett Nose M.D.   On: 08/25/2023 00:56     PROCEDURES:  Critical Care performed: No     Procedures    IMPRESSION / MDM / ASSESSMENT AND PLAN / ED COURSE  I reviewed the triage vital signs and the nursing notes.    Patient here with lower back pain.  No red flag symptoms.     DIFFERENTIAL DIAGNOSIS (includes but not limited to):   Muscle strain, muscle spasm, doubt cauda equina, epidural abscess or hematoma, discitis or osteomyelitis, transverse myelitis, fracture   Patient's presentation is most consistent with acute complicated illness / injury requiring diagnostic workup.   PLAN: X-ray of the back obtained from triage reviewed and interpreted by myself and the radiologist and is unremarkable.  Pregnancy test negative.  Low suspicion for UTI, kidney stones, pyelonephritis.  This seems to be musculoskeletal in nature.  No red flag symptoms today.  No indication for emergent MRI.  Will have her continue anti-inflammatories and will discharge with Lidoderm patches, Robaxin.  Have  placed a referral for PCP.  Discussed alternating heat and ice.  Patient comfortable with this plan.   MEDICATIONS GIVEN IN ED: Medications  lidocaine (LIDODERM) 5 % 1 patch (1 patch Transdermal Patch Applied 08/25/23 0147)  ibuprofen (ADVIL) tablet 800 mg (800 mg Oral Given 08/25/23 0147)  methocarbamol (ROBAXIN) tablet 1,000 mg (1,000 mg Oral Given 08/25/23 0147)     ED COURSE:  At this time, I do not feel there is any life-threatening condition present. I reviewed all nursing notes, vitals, pertinent previous records.  All lab and urine results, EKGs, imaging ordered have been  independently reviewed and interpreted by myself.  I reviewed all available radiology reports from any imaging ordered this visit.  Based on my assessment, I feel the patient is safe to be discharged home without further emergent workup and can continue workup as an outpatient as needed. Discussed all findings, treatment plan as well as usual and customary return precautions.  They verbalize understanding and are comfortable with this plan.  Outpatient follow-up has been provided as needed.  All questions have been answered.    CONSULTS:  none   OUTSIDE RECORDS REVIEWED: Reviewed last PCP visit on 11/26/2022.       FINAL CLINICAL IMPRESSION(S) / ED DIAGNOSES   Final diagnoses:  Acute left-sided low back pain without sciatica     Rx / DC Orders   ED Discharge Orders          Ordered    methocarbamol (ROBAXIN) 500 MG tablet  Every 8 hours PRN        08/25/23 0133    lidocaine (LIDODERM) 5 %  Every 12 hours        08/25/23 0133    ibuprofen (ADVIL) 800 MG tablet  Every 8 hours PRN        08/25/23 0133    Ambulatory Referral to Primary Care (Establish Care)        08/25/23 0133             Note:  This document was prepared using Dragon voice recognition software and may include unintentional dictation errors.   Beverlyann Broxterman, Layla Maw, DO 08/25/23 0205

## 2023-08-26 ENCOUNTER — Telehealth: Payer: Self-pay

## 2023-08-26 NOTE — Transitions of Care (Post Inpatient/ED Visit) (Unsigned)
   08/26/2023  Name: ALEZANDRA EGLI MRN: [id number] DOB: 12/28/2000  Today's TOC FU Call Status: Today's TOC FU Call Status:: Unsuccessful Call (1st Attempt) Unsuccessful Call (1st Attempt) Date: 08/26/23  Attempted to reach the patient regarding the most recent Inpatient/ED visit.  Follow Up Plan: Additional outreach attempts will be made to reach the patient to complete the Transitions of Care (Post Inpatient/ED visit) call.   Signature Karena Addison, LPN [facility] Nurse Health Advisor Direct Dial [phone]

## 2023-08-28 NOTE — Transitions of Care (Post Inpatient/ED Visit) (Signed)
   08/28/2023  Name: Sabrina Bell MRN: [id number] DOB: 2001/01/15  Today's TOC FU Call Status: Today's TOC FU Call Status:: Successful TOC FU Call Completed Unsuccessful Call (1st Attempt) Date: 08/26/23 [facility] FU Call Complete Date: 08/28/23  Attempted to reach the patient regarding the most recent Inpatient/ED visit.  Follow Up Plan: No further outreach attempts will be made at this time. We have been unable to contact the patient. Error no PCP Signature Karena Addison, LPN [facility] Nurse Health Advisor Direct Dial [phone]

## 2023-09-18 DIAGNOSIS — J9601 Acute respiratory failure with hypoxia: Secondary | ICD-10-CM | POA: Diagnosis not present

## 2023-10-06 DIAGNOSIS — R0602 Shortness of breath: Secondary | ICD-10-CM | POA: Diagnosis not present

## 2023-10-06 DIAGNOSIS — Z8709 Personal history of other diseases of the respiratory system: Secondary | ICD-10-CM | POA: Diagnosis not present

## 2023-10-08 ENCOUNTER — Other Ambulatory Visit: Payer: Self-pay

## 2023-10-08 ENCOUNTER — Emergency Department: Payer: BC Managed Care – PPO

## 2023-10-08 DIAGNOSIS — Z20822 Contact with and (suspected) exposure to covid-19: Secondary | ICD-10-CM | POA: Insufficient documentation

## 2023-10-08 DIAGNOSIS — B349 Viral infection, unspecified: Secondary | ICD-10-CM | POA: Insufficient documentation

## 2023-10-08 DIAGNOSIS — R059 Cough, unspecified: Secondary | ICD-10-CM | POA: Diagnosis not present

## 2023-10-08 DIAGNOSIS — J029 Acute pharyngitis, unspecified: Secondary | ICD-10-CM | POA: Diagnosis not present

## 2023-10-08 LAB — RESP PANEL BY RT-PCR (RSV, FLU A&B, COVID)  RVPGX2
Influenza A by PCR: NEGATIVE
Influenza B by PCR: NEGATIVE
Resp Syncytial Virus by PCR: NEGATIVE
SARS Coronavirus 2 by RT PCR: NEGATIVE

## 2023-10-08 LAB — GROUP A STREP BY PCR: Group A Strep by PCR: NOT DETECTED

## 2023-10-08 NOTE — ED Triage Notes (Signed)
Pt reports cough congestion sore throat since earlier today.

## 2023-10-09 ENCOUNTER — Emergency Department
Admission: EM | Admit: 2023-10-09 | Discharge: 2023-10-09 | Disposition: A | Discharge status: Home / Self Care | Payer: BC Managed Care – PPO | Attending: Emergency Medicine | Admitting: Emergency Medicine

## 2023-10-09 DIAGNOSIS — B349 Viral infection, unspecified: Secondary | ICD-10-CM

## 2023-10-09 NOTE — ED Provider Notes (Signed)
[facility] Provider Note    Event Date/Time   First MD Initiated Contact with Patient 10/09/23 0145     (approximate)   History   Cough   HPI Sabrina Bell is a 22 y.o. female who presents for evaluation of 1 day of cough, congestion, and mild sore throat.  Symptoms started essentially a few hours ago (within the last 24 hours).  Symptoms are mild.  No difficulty swallowing or speaking.  History of asthma but does not feel like she is having an asthma exacerbation or wheezing.     Physical Exam   Triage Vital Signs: ED Triage Vitals  Encounter Vitals Group     BP 10/08/23 2232 (!) 143/70     Systolic BP Percentile --      Diastolic BP Percentile --      Pulse Rate 10/08/23 2232 91     Resp 10/08/23 2232 20     Temp 10/08/23 2232 98 F (36.7 C)     Temp src --      SpO2 10/08/23 2232 96 %     Weight 10/08/23 2231 (!) 226.8 kg (500 lb)     Height 10/08/23 2231 1.702 m (5\' 7" )     Head Circumference --      Peak Flow --      Pain Score 10/08/23 2231 6     Pain Loc --      Pain Education --      Exclude from Growth Chart --     Most recent vital signs: Vitals:   10/08/23 2232  BP: (!) 143/70  Pulse: 91  Resp: 20  Temp: 98 F (36.7 C)  SpO2: 96%    General: Awake, no distress.  CV:  Good peripheral perfusion.  Regular rate and rhythm Resp:  Normal effort. Speaking easily and comfortably, no accessory muscle usage nor intercostal retractions.  Lungs are clear to auscultation with no wheezing, rales, nor rhonchi. Abd:  Super morbid obesity.   ED Results / Procedures / Treatments   Labs (all labs ordered are listed, but only abnormal results are displayed) Labs Reviewed  RESP PANEL BY RT-PCR (RSV, FLU A&B, COVID)  RVPGX2  GROUP A STREP BY PCR     RADIOLOGY I viewed and interpreted the patient's two-view chest x-ray and there is no evidence of pneumonia or pulmonary edema.  I also read the radiologist's report, which confirmed  no acute findings.   PROCEDURES:  Critical Care performed: No  Procedures    IMPRESSION / MDM / ASSESSMENT AND PLAN / ED COURSE  I reviewed the triage vital signs and the nursing notes.                              Differential diagnosis includes, but is not limited to, viral illness, strep pharyngitis, less likely pneumonia.  Patient's presentation is most consistent with acute complicated illness / injury requiring diagnostic workup.  Labs/studies ordered: 2 view chest x-ray, respiratory viral panel, group A strep PCR  Interventions/Medications given:  Medications - No data to display  (Note:  hospital course my include additional interventions and/or labs/studies not listed above.)   Generally well-appearing, normal vital signs, negative labs and normal chest x-ray.  I had my usual and customary viral illness management recommendations and return precautions.         FINAL CLINICAL IMPRESSION(S) / ED DIAGNOSES   Final diagnoses:  Viral illness  Rx / DC Orders   ED Discharge Orders          Ordered    Ambulatory Referral to Primary Care (Establish Care)        10/09/23 0218             Note:  This document was prepared using Dragon voice recognition software and may include unintentional dictation errors.   Loleta Rose, MD 10/09/23 [phone]

## 2023-10-10 DIAGNOSIS — J069 Acute upper respiratory infection, unspecified: Secondary | ICD-10-CM | POA: Diagnosis not present

## 2023-10-18 DIAGNOSIS — J9601 Acute respiratory failure with hypoxia: Secondary | ICD-10-CM | POA: Diagnosis not present

## 2023-10-21 ENCOUNTER — Telehealth: Payer: Self-pay

## 2023-10-21 NOTE — Progress Notes (Signed)
 Transition Care Management Follow-up Telephone Call Date of discharge and from where: 10/09/2023 [facility] How have you been since you were released from the hospital? Patient declined to participate.  Jemal Miskell [facility]  [facility], [facility] Guide Direct Dial: [phone]  Website: [email or web address]

## 2023-10-30 DIAGNOSIS — M25532 Pain in left wrist: Secondary | ICD-10-CM | POA: Diagnosis not present

## 2023-10-30 DIAGNOSIS — S63502A Unspecified sprain of left wrist, initial encounter: Secondary | ICD-10-CM | POA: Diagnosis not present

## 2024-02-03 NOTE — Progress Notes (Unsigned)
 Cardiology Clinic Note   Date: 02/05/2024 ID: Sabrina Bell, DOB 01/12/2001, MRN [id number]  Primary Cardiologist:  Debbe Odea, MD  Chief Complaint   Sabrina Bell is a 23 y.o. female who presents to the clinic today for overdue follow up.   Patient Profile   Sabrina Bell is followed by Dr. Azucena Cecil for the history outlined below.      Past medical history significant for: Chest pain. Echo 07/25/2022: EF 60 to 65%.  No RWMA.  Indeterminate diastolic parameters.  Normal RV size/function.  No significant valvular abnormalities. Dizziness/NSVT. 14-day ZIO 07/12/2022: HR 51 to 190 bpm, average 93 bpm.  Predominantly sinus rhythm.  1 run of NSVT lasting 9.6 seconds with max rate 190 bpm.  Rare ectopy. OSA. Asthma. Prediabetes. Anxiety and depression. Morbid obesity. Iron deficiency anemia.  In summary, patient was first evaluated by Dr. Azucena Cecil on 06/11/2022 for dizziness at the request of Corliss Parish, MD.  She reported a 1 month history of dizziness occurring randomly lasting from 5 minutes to 30 minutes.  She denied associated palpitations or syncope.  She was evaluated in the emergency room 3 days prior with unremarkable workup.  Pediatric echo in 2018 was grossly normal.  14-day ZIO demonstrated 1 run of NSVT lasting 9 seconds with max rate 190 bpm.  Echo demonstrated normal LV/RV function as detailed above.  Patient was last seen in the office by Eula Listen, PA-C on 09/03/2022 for follow-up after testing.  She was doing well at that time.  She reported CPAP machine not working and no provider to help manage that.  She was referred to sleep medicine.     History of Present Illness    Today, patient reports overall she is doing well. She denies palpitations. She reports some chest tightness related to difficulty breathing that improves with inhalers. She reports occasional lightheadedness and presyncope that passes when she rests. She is living out of a hotel  right now while she and her family try to find some place to live. Unfortunately her father passed away 7 months ago. She is trying to get her health back in order has a visit coming up with pulmonology on 5/7. She had a visit with a PCP but missed the appointment. She plans to contact that office to reschedule the visit. She never got her CPAP machine fixed and has not been using it for a couple of hours.     ROS: All other systems reviewed and are otherwise negative except as noted in History of Present Illness.  EKGs/Labs Reviewed    EKG Interpretation Date/Time:  Thursday February 05 2024 15:32:23 EDT Ventricular Rate:  106 PR Interval:  160 QRS Duration:  84 QT Interval:  344 QTC Calculation: 456 R Axis:   20  Text Interpretation: Sinus tachycardia When compared with ECG of 19-Jul-2023 15:06, No significant change was found Confirmed by Carlos Levering [phone]) on 02/05/2024 3:45:33 PM   07/19/2023: BUN 10; Creatinine, Ser 0.74; Potassium 3.8; Sodium 139   07/19/2023: Hemoglobin 9.4; WBC 7.0    Physical Exam    VS:  BP 137/76 (BP Location: Left Arm)   Pulse (!) 106   Ht 5\' 7"  (1.702 m)   Wt (!) 591 lb (268.1 kg)   SpO2 94%   BMI 92.56 kg/m  , BMI Body mass index is 92.56 kg/m.  GEN: Well nourished, well developed, in no acute distress. Neck: No JVD or carotid bruits. Cardiac:  RRR. No murmurs. No rubs or  gallops.   Respiratory:  Respirations regular and unlabored. Clear to auscultation without rales, wheezing or rhonchi. GI: Soft, nontender, nondistended. Extremities: Radials/DP/PT 2+ and equal bilaterally. No clubbing or cyanosis. Edema difficult to assess secondary to body habitus.   Skin: Warm and dry, no rash. Neuro: Strength intact.  Assessment & Plan   Chest pain/dyspnea Echo October 2023 showed normal LV/RV function, no RWMA, indeterminate diastolic parameters, no significant valvular abnormalities.  Patient reports chest tightness when having difficulty  breathing. She has a history of asthma. Dyspnea and chest tightness improve with use of inhalers. She has a visit pending with pulmonology. Lungs clear to auscultation without wheezing, rhonchi or rales today.  -No further workup indicated at this time.  -Keep visit with pulmonology on 5/7.   Dizziness/NSVT 14-day ZIO September 2023 showed 1 run of NSVT lasting 9.6 seconds with max rate 190 bpm, heart rate 51 to 190 bpm, average 93 bpm.  Patient reports occasionally feeling lightheaded and presyncopal that passes when she rests. Discussed slow position changes. She denies palpitations.  -No further workup indicated at this time.   OSA Patient has not been able to use her CPAP machine for a couple of years. She has an upcoming visit with pulmonology on 5/7.  -Keep visit with pulmonology for possible repeat sleep study.   Morbid obesity Patient's weight has increased 80 lb since last visit in November 2023. She admits to letting her health go in the last couple of years. She is living out of a hotel right now while she and her family look for a place to live. Discussed portion control and working with PCP to lose weight.  -Reschedule visit with PCP.   Disposition: Return in 1 year or sooner as needed.          Signed, Lonell Rives. Jennel Mara, DNP, NP-C

## 2024-02-05 ENCOUNTER — Encounter: Payer: Self-pay | Admitting: Student

## 2024-02-05 ENCOUNTER — Ambulatory Visit: Attending: Student | Admitting: Student

## 2024-02-05 VITALS — BP 137/76 | HR 106 | Ht 67.0 in | Wt >= 6400 oz

## 2024-02-05 DIAGNOSIS — Z6841 Body Mass Index (BMI) 40.0 and over, adult: Secondary | ICD-10-CM

## 2024-02-05 DIAGNOSIS — G4733 Obstructive sleep apnea (adult) (pediatric): Secondary | ICD-10-CM

## 2024-02-05 DIAGNOSIS — I4729 Other ventricular tachycardia: Secondary | ICD-10-CM

## 2024-02-05 DIAGNOSIS — R42 Dizziness and giddiness: Secondary | ICD-10-CM

## 2024-02-05 NOTE — Patient Instructions (Signed)
 Medication Instructions:  Your physician recommends that you continue on your current medications as directed. Please refer to the Current Medication list given to you today.  *If you need a refill on your cardiac medications before your next appointment, please call your pharmacy*  Follow-Up: At [facility], you and your health needs are our priority.  As part of our continuing mission to provide you with exceptional heart care, our providers are all part of one team.  This team includes your primary Cardiologist (physician) and Advanced Practice Providers or APPs (Physician Assistants and Nurse Practitioners) who all work together to provide you with the care you need, when you need it.  Your next appointment:   12 month(s)  Provider:   Constancia Delton, MD or Morey Ar, NP    We recommend signing up for the patient portal called "MyChart".  Sign up information is provided on this After Visit Summary.  MyChart is used to connect with patients for Virtual Visits (Telemedicine).  Patients are able to view lab/test results, encounter notes, upcoming appointments, etc.  Non-urgent messages can be sent to your provider as well.   To learn more about what you can do with MyChart, go to [email or web address].

## 2024-02-26 ENCOUNTER — Ambulatory Visit: Admitting: Internal Medicine

## 2024-02-26 ENCOUNTER — Encounter: Payer: Self-pay | Admitting: Internal Medicine

## 2024-02-26 ENCOUNTER — Other Ambulatory Visit
Admission: RE | Admit: 2024-02-26 | Discharge: 2024-02-26 | Disposition: A | Discharge status: Home / Self Care | Source: Ambulatory Visit | Attending: Internal Medicine | Admitting: Internal Medicine

## 2024-02-26 VITALS — BP 130/90 | HR 102 | Temp 98.5°F | Ht 67.0 in | Wt >= 6400 oz

## 2024-02-26 DIAGNOSIS — J4540 Moderate persistent asthma, uncomplicated: Secondary | ICD-10-CM | POA: Diagnosis not present

## 2024-02-26 DIAGNOSIS — G4710 Hypersomnia, unspecified: Secondary | ICD-10-CM | POA: Diagnosis not present

## 2024-02-26 LAB — NITRIC OXIDE: Nitric Oxide: 9

## 2024-02-26 MED ORDER — BUDESONIDE-FORMOTEROL FUMARATE 160-4.5 MCG/ACT IN AERO: 2.0000 | INHALATION_SPRAY | Freq: Two times a day (BID) | RESPIRATORY_TRACT | 12 refills | Status: AC

## 2024-02-26 NOTE — Patient Instructions (Addendum)
 Recommend Symbicort 2 puffs twice daily Please rinse mouth after use Recommend breathing test  OBTAIN ALLERGY TESTING BLOOD WORK  Use albuterol  as needed Avoid Allergens and Irritants Avoid secondhand smoke Avoid SICK contacts Recommend  Masking  when appropriate Recommend Keep up-to-date with vaccinations Recommend home sleep study for assessment of sleep apnea

## 2024-02-26 NOTE — Progress Notes (Signed)
 [facility] Pulmonary Medicine Consultation      Date: 02/26/2024,   MRN# [id number] MELMA LAZOR Oct 22, 2000     CHIEF COMPLAINT:   Assessment of asthma   HISTORY OF PRESENT ILLNESS   23 year old pleasant white female with super morbid obesity approximately 600 pounds is here for assessment for her asthma and assessment for sleep apnea  No exacerbation at this time No evidence of heart failure at this time No evidence or signs of infection at this time No respiratory distress No fevers, chills, nausea, vomiting, diarrhea No evidence of lower extremity edema No evidence hemoptysis 3 dogs 1 pet, no triggers Previous history of COVID infection  Assessment of ASTHMA FeNO  9  ppb-not  consistent with type II inflammation But will started inhaled corticosteroids and long-acting beta agonist   Patient is seen today for problems and issues with sleep related to excessive daytime sleepiness Patient  has been having sleep problems for many years Patient has been having excessive daytime sleepiness for a long time Patient has been having extreme fatigue and tiredness, lack of energy +  very Loud snoring every night + struggling breathe at night and gasps for air   Discussed sleep data and reviewed with patient.  Encouraged proper weight management.  Discussed driving precautions and its relationship with hypersomnolence.  Discussed operating dangerous equipment and its relationship with hypersomnolence.  Discussed sleep hygiene, and benefits of a fixed sleep waked time.  The importance of getting eight or more hours of sleep discussed with patient.  Discussed limiting the use of the computer and television before bedtime.  Decrease naps during the day, so night time sleep will become enhanced.  Limit caffeine , and sleep deprivation.  HTN, stroke, and heart failure are potential risk factors.   Discussed risk of untreated sleep apnea including cardiac arrhthymias, stroke,  DM, pulm HTN.    EPWORTH SLEEP SCORE 9      02/26/2024    2:00 PM 03/03/2017    4:00 PM  Results of the Epworth flowsheet  Sitting and reading 3 3  Watching TV 3 3  Sitting, inactive in a public place (e.g. a theatre or a meeting) 1 0  As a passenger in a car for an hour without a break 3 3  Lying down to rest in the afternoon when circumstances permit 3 3  Sitting and talking to someone 1 0  Sitting quietly after a lunch without alcohol 2 0  In a car, while stopped for a few minutes in traffic 0 0  Total score 16 12     PAST MEDICAL HISTORY   Past Medical History:  Diagnosis Date   Asthma    Borderline diabetes    Depression    Migraines    Obesity      SURGICAL HISTORY   Past Surgical History:  Procedure Laterality Date   NO PAST SURGERIES       FAMILY HISTORY   Family History  Problem Relation Age of Onset   Diabetes Mother    Vision loss Mother    Thyroid  disease Father    Healthy Brother    Ovarian cancer Paternal Grandmother    Prostate cancer Paternal Grandfather      SOCIAL HISTORY   Social History   Tobacco Use   Smoking status: Never   Smokeless tobacco: Never  Vaping Use   Vaping status: Never Used  Substance Use Topics   Alcohol use: No   Drug use: No  MEDICATIONS    Home Medication:  Current Outpatient Rx   Order #: [id number] Class: Normal   Order #: [id number] Class: Normal   Order #: [id number] Class: Normal   Order #: [id number] Class: Normal   Order #: [id number] Class: Normal    Current Medication:  Current Outpatient Medications:    albuterol  (VENTOLIN  HFA) 108 (90 Base) MCG/ACT inhaler, Inhale 2 puffs into the lungs every 4 (four) hours as needed for wheezing or shortness of breath., Disp: 8 g, Rfl: 0   EPINEPHrine  0.3 mg/0.3 mL IJ SOAJ injection, Inject 0.3 mLs (0.3 mg total) into the muscle as needed for anaphylaxis., Disp: 1 each, Rfl: 1   ibuprofen  (ADVIL ) 800 MG tablet, Take 1 tablet (800 mg total) by mouth every 8  (eight) hours as needed., Disp: 30 tablet, Rfl: 0   lidocaine  (LIDODERM ) 5 %, Place 1 patch onto the skin every 12 (twelve) hours. Remove & Discard patch within 12 hours or as directed by MD, Disp: 10 patch, Rfl: 0   medroxyPROGESTERone  (PROVERA ) 10 MG tablet, TAKE 1 TABLET BY MOUTH EVERY DAY, Disp: 90 tablet, Rfl: 1    ALLERGIES   Cashew nut oil, Citrullus vulgaris, Omnicef [cefdinir], Other, and Watermelon flavor [flavoring agent (non-screening)]   BP (!) 130/90 (BP Location: Right Arm, Patient Position: Sitting, Cuff Size: Large) Comment (BP Location): forearm  Pulse (!) 102   Temp 98.5 F (36.9 C) (Oral)   Ht 5\' 7"  (1.702 m)   Wt (!) 585 lb 6.4 oz (265.5 kg)   SpO2 92%   BMI 91.69 kg/m     Review of Systems: Gen:  Denies  fever, sweats, chills weight loss  HEENT: Denies blurred vision, double vision, ear pain, eye pain, hearing loss, nose bleeds, sore throat Cardiac:  No dizziness, chest pain or heaviness, chest tightness,edema, No JVD Resp:   + cough, -sputum production, +shortness of breath,+wheezing, -hemoptysis,  Other:  All other systems negative   Physical Examination:   General Appearance: No distress  EYES PERRLA, EOM intact.   NECK Supple, No JVD Pulmonary: normal breath sounds, No wheezing.  CardiovascularNormal S1,S2.  No m/r/g.   Abdomen: Benign, Soft, non-tender. Neurology UE/LE 5/5 strength, no focal deficits Ext pulses intact, cap refill intact ALL OTHER ROS ARE NEGATIVE       ASSESSMENT/PLAN   23 year old pleasant white female with super morbid obesity with moderate persistent asthma with signs symptoms of underlying obstructive sleep apnea in the setting of sleep apnea given morbid obesity and deconditioned state  Assessment of asthma Recurrent pulmonary function testing Recommend starting Symbicort inhaler 2 puffs twice a day Continue albuterol  as needed Avoid Allergens and Irritants Avoid secondhand smoke Avoid SICK contacts Recommend   Masking  when appropriate Recommend Keep up-to-date with vaccinations Recommend allergy testing Check IgE level  Assessment of OSA Recommend home sleep study  Obesity -recommend significant weight loss -recommend changing diet  Deconditioned state -Recommend increased daily activity and exercise     MEDICATION ADJUSTMENTS/LABS AND TESTS ORDERED: Recommend Symbicort 2 puffs twice daily Please rinse mouth after use Recommend PFTs OBTAIN ALLERGY TESTING BLOOD WORK Use albuterol  as needed Avoid Allergens and Irritants Avoid secondhand smoke Avoid SICK contacts Recommend  Masking  when appropriate Recommend Keep up-to-date with vaccinations Recommend home sleep study for assessment of sleep apnea   CURRENT MEDICATIONS REVIEWED AT LENGTH WITH PATIENT TODAY   Patient  satisfied with Plan of action and management. All questions answered  Follow up 4 weeks  I spent a total of 65 minutes  reviewing chart data, face-to-face evaluation with the patient, counseling and coordination of care as detailed above.     Lady Pier, M.D.  [facility]  Medical Director [facility] Medical Director [facility] Cardio-Pulmonary Department

## 2024-02-28 LAB — MISC LABCORP TEST (SEND OUT)
Labcorp test code: 600531
Labcorp test code: 604567
Labcorp test code: 606300

## 2024-02-28 LAB — IGE: IgE (Immunoglobulin E), Serum: 393 [IU]/mL (ref 6–495)

## 2024-03-04 LAB — MISC LABCORP TEST (SEND OUT)
Labcorp test code: 9985
Labcorp test code: 9985

## 2024-04-20 ENCOUNTER — Encounter

## 2024-04-20 ENCOUNTER — Ambulatory Visit: Admitting: Internal Medicine

## 2024-07-07 ENCOUNTER — Ambulatory Visit: Admitting: Internal Medicine

## 2024-07-07 ENCOUNTER — Encounter

## 2024-08-27 DIAGNOSIS — Z1330 Encounter for screening examination for mental health and behavioral disorders, unspecified: Secondary | ICD-10-CM | POA: Diagnosis not present

## 2024-08-27 DIAGNOSIS — Z1331 Encounter for screening for depression: Secondary | ICD-10-CM | POA: Diagnosis not present

## 2024-08-27 DIAGNOSIS — I10 Essential (primary) hypertension: Secondary | ICD-10-CM | POA: Diagnosis not present

## 2024-08-27 DIAGNOSIS — R7303 Prediabetes: Secondary | ICD-10-CM | POA: Diagnosis not present

## 2024-09-03 DIAGNOSIS — Z1159 Encounter for screening for other viral diseases: Secondary | ICD-10-CM | POA: Diagnosis not present

## 2024-09-03 DIAGNOSIS — R7303 Prediabetes: Secondary | ICD-10-CM | POA: Diagnosis not present

## 2024-09-03 DIAGNOSIS — E611 Iron deficiency: Secondary | ICD-10-CM | POA: Diagnosis not present

## 2024-09-03 DIAGNOSIS — Z114 Encounter for screening for human immunodeficiency virus [HIV]: Secondary | ICD-10-CM | POA: Diagnosis not present

## 2024-09-03 DIAGNOSIS — N921 Excessive and frequent menstruation with irregular cycle: Secondary | ICD-10-CM | POA: Diagnosis not present

## 2024-09-03 DIAGNOSIS — I10 Essential (primary) hypertension: Secondary | ICD-10-CM | POA: Diagnosis not present

## 2024-09-03 DIAGNOSIS — Z1329 Encounter for screening for other suspected endocrine disorder: Secondary | ICD-10-CM | POA: Diagnosis not present

## 2024-09-08 DIAGNOSIS — N921 Excessive and frequent menstruation with irregular cycle: Secondary | ICD-10-CM | POA: Diagnosis not present

## 2024-09-08 DIAGNOSIS — D500 Iron deficiency anemia secondary to blood loss (chronic): Secondary | ICD-10-CM | POA: Diagnosis not present

## 2024-09-08 DIAGNOSIS — R7303 Prediabetes: Secondary | ICD-10-CM | POA: Diagnosis not present

## 2024-09-08 DIAGNOSIS — F419 Anxiety disorder, unspecified: Secondary | ICD-10-CM | POA: Diagnosis not present

## 2024-09-08 DIAGNOSIS — F332 Major depressive disorder, recurrent severe without psychotic features: Secondary | ICD-10-CM | POA: Diagnosis not present

## 2024-09-08 NOTE — Progress Notes (Signed)
 Chief Complaint:   Chief Complaint  Patient presents with  . Lab Results    Subjective  History of Present Illness Sabrina Bell is a 23 year old female presents to discuss lab results.  She experiences heavy menstrual bleeding, which has been ongoing for a significant period. Previously, she managed her symptoms with oral contraceptive pills, taking two 10 mg tablets twice a day of Provera , which helped reduce the bleeding. However, she is no longer on this regimen and has not tried other treatments for her heavy periods. She has not received an iron  infusion before and oral iron  supplements upset her stomach.  She has sores on her abdomen that bleed profusely, described as 'like a murder scene.', due to the excessive bleeding. These sores have been present for a long time and bleed heavily, forming large puddles on the floor. The bleeding is managed temporarily with Band-Aids, but the sores continue to ooze a clear fluid. The sores are sensitive and sometimes itch, especially when her anxiety is high. She has tried various creams for the sores without success. She believes she has a history of eczema.  She experiences chronic chest pain and shortness of breath, which occur both at rest and with activity. These symptoms have been present for weeks to months. She also feels constantly tired, which she attributes to her low hemoglobin levels.  She is currently taking Prozac , which helps with her mood. She and her caregiver have expressed concern about mood symptoms and are seeking medication management for these issues. She is also in the process of applying for SSI due to her inability to work, as she cannot stand for long periods due to her symptoms.   Patient Active Problem List  Diagnosis  . Morbid obesity with BMI of 70 and over, adult (CMS-HCC)  . Vitamin D deficiency  . Insulin  resistance  . Menorrhagia with irregular cycle  . Essential hypertension  . Anxiety and depression  .  Iron  deficiency anemia  . Prediabetes  . Asthma (HHS-HCC)  . Insomnia  . MDD (major depressive disorder), recurrent episode, severe (CMS/HHS-HCC)  . PCOS (polycystic ovarian syndrome)  . Obstructive sleep apnea  . Sleep apnea    Outpatient Medications Prior to Visit  Medication Sig Dispense Refill  . albuterol  MDI, PROVENTIL , VENTOLIN , PROAIR , HFA 90 mcg/actuation inhaler Inhale 2 inhalations into the lungs every 4 (four) hours as needed for Wheezing or Shortness of Breath    . FLUoxetine  (PROZAC ) 10 MG capsule Take 1 capsule (10 mg total) by mouth once daily 90 capsule 0   No facility-administered medications prior to visit.     Objective  Vitals:   09/08/24 1531  BP: (!) 149/87  Pulse: 105  Resp: 16  PainSc: 0-No pain   There is no height or weight on file to calculate BMI. Home Vitals:     Physical Exam  Physical Exam Pulmonary:     Effort: Respiratory distress present.     Comments: Dyspnea when walking  Skin:    Comments: See photo  Neurological:     Mental Status: She is alert.  Psychiatric:        Mood and Affect: Mood normal.        Behavior: Behavior normal.        Thought Content: Thought content normal.        Judgment: Judgment normal.      Lab Results  Component Value Date   WBC 10.2 (H) 09/03/2024   HGB 7.7 (L) 09/03/2024  HCT 28.6 (L) 09/03/2024   PLT 324 09/03/2024   Lab Results  Component Value Date   HGBA1C 5.8 (H) 09/03/2024   Lab Results  Component Value Date   IRON  25 (L) 09/03/2024   TIBC 351 09/03/2024   FERRITIN 14 09/03/2024    Results   Assessment/Plan:   Assessment & Plan Iron  deficiency anemia due to chronic blood loss Chronic iron  deficiency anemia likely due to excessive menstrual bleeding and chronic abdominal skin ulcerations. Hemoglobin levels are low, contributing to fatigue and pallor. Oral iron  supplements are not well tolerated due to gastrointestinal side effects. - Advised ER visit for chest pain and  potential blood transfusion, Hmg 7.7 - Follow up after ER   Excessive and frequent menstruation with irregular cycle Heavy menstrual bleeding contributing to iron  deficiency anemia. Previous use of Provera  was effective in reducing bleeding. - Prescribed Provera  10 mg twice daily. - Will reevaluate menstrual bleeding in a couple of months.  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 No signs of infection observed. - Ordered skin culture to rule out infection. - Referred to dermatologist for further evaluation.  Morbid obesity with BMI of 70 and over, adult Morbid obesity with a BMI over 70. Scheduled for a weight loss clinic consultation. - Continue with scheduled weight loss clinic consultation.  Depression and anxiety disorder Depression and anxiety managed with fluoxetine . Current dose is 10 mg, which is low. Prozac  is helping with mood stabilization. - Increased fluoxetine  to 20 mg daily. - Ensured therapy referral includes medication discussion.  Diagnoses and all orders for this visit:  Menorrhagia with irregular cycle -     medroxyPROGESTERone  (PROVERA ) 10 MG tablet; Take 1 tablet (10 mg total) by mouth 2 (two) times daily for 60 days  Morbid obesity with BMI of 70 and over, adult (CMS-HCC)  Iron  deficiency anemia due to chronic blood loss  Prediabetes  Anxiety and depression -     FLUoxetine  (PROZAC ) 20 MG capsule; Take 1 capsule (20 mg total) by mouth once daily  Severe episode of recurrent major depressive disorder, without psychotic features (CMS/HHS-HCC) -     FLUoxetine  (PROZAC ) 20 MG capsule; Take 1 capsule (20 mg total) by mouth once daily  Wound drainage -     Culture, Aerobes and Anaerobes, Other w Gram Stain -     Ambulatory Referral to Dermatology  Staph aureus infection -     Ambulatory Referral to Dermatology    This visit was  coded based on medical decision making (MDM).           Future Appointments     Date/Time Provider Department Center Visit Type   09/23/2024 2:20 PM (Arrive by 2:05 PM) Jeronimo Greig Pfeiffer, PA [facility] CONSULT   09/27/2024 1:00 PM (Arrive by 12:45 PM) Nicoletta Morna Bouchard, LCSW [facility] NEW THERAPY VIDEO       There are no Patient Instructions on file for this visit.  An after visit summary was provided for the patient either in written format (printed) or through My [facility].  This note has been created using automated tools and reviewed for accuracy by [facility] ANN DEVINE.

## 2024-09-21 ENCOUNTER — Emergency Department

## 2024-09-21 ENCOUNTER — Other Ambulatory Visit: Payer: Self-pay

## 2024-09-21 ENCOUNTER — Emergency Department
Admission: EM | Admit: 2024-09-21 | Discharge: 2024-09-21 | Disposition: A | Discharge status: Home / Self Care | Attending: Emergency Medicine | Admitting: Emergency Medicine

## 2024-09-21 DIAGNOSIS — I959 Hypotension, unspecified: Secondary | ICD-10-CM | POA: Diagnosis not present

## 2024-09-21 DIAGNOSIS — D649 Anemia, unspecified: Secondary | ICD-10-CM

## 2024-09-21 DIAGNOSIS — D509 Iron deficiency anemia, unspecified: Secondary | ICD-10-CM | POA: Insufficient documentation

## 2024-09-21 DIAGNOSIS — R519 Headache, unspecified: Secondary | ICD-10-CM | POA: Diagnosis not present

## 2024-09-21 DIAGNOSIS — N939 Abnormal uterine and vaginal bleeding, unspecified: Secondary | ICD-10-CM | POA: Insufficient documentation

## 2024-09-21 DIAGNOSIS — R7889 Finding of other specified substances, not normally found in blood: Secondary | ICD-10-CM | POA: Diagnosis not present

## 2024-09-21 DIAGNOSIS — R55 Syncope and collapse: Secondary | ICD-10-CM | POA: Insufficient documentation

## 2024-09-21 DIAGNOSIS — R509 Fever, unspecified: Secondary | ICD-10-CM | POA: Diagnosis not present

## 2024-09-21 DIAGNOSIS — R42 Dizziness and giddiness: Secondary | ICD-10-CM | POA: Diagnosis not present

## 2024-09-21 DIAGNOSIS — W19XXXA Unspecified fall, initial encounter: Secondary | ICD-10-CM | POA: Diagnosis not present

## 2024-09-21 LAB — CBC WITH DIFFERENTIAL/PLATELET
Abs Immature Granulocytes: 0.03 K/uL (ref 0.00–0.07)
Basophils Absolute: 0 K/uL (ref 0.0–0.1)
Basophils Relative: 0 %
Eosinophils Absolute: 0.1 K/uL (ref 0.0–0.5)
Eosinophils Relative: 1 %
HCT: 30.1 % — ABNORMAL LOW (ref 36.0–46.0)
Hemoglobin: 8.2 g/dL — ABNORMAL LOW (ref 12.0–15.0)
Immature Granulocytes: 0 %
Lymphocytes Relative: 19 %
Lymphs Abs: 1.3 K/uL (ref 0.7–4.0)
MCH: 21.5 pg — ABNORMAL LOW (ref 26.0–34.0)
MCHC: 27.2 g/dL — ABNORMAL LOW (ref 30.0–36.0)
MCV: 79 fL — ABNORMAL LOW (ref 80.0–100.0)
Monocytes Absolute: 0.5 K/uL (ref 0.1–1.0)
Monocytes Relative: 7 %
Neutro Abs: 5.1 K/uL (ref 1.7–7.7)
Neutrophils Relative %: 73 %
Platelets: UNDETERMINED K/uL (ref 150–400)
RBC: 3.81 MIL/uL — ABNORMAL LOW (ref 3.87–5.11)
RDW: 18.3 % — ABNORMAL HIGH (ref 11.5–15.5)
Smear Review: NORMAL
WBC: 7 K/uL (ref 4.0–10.5)
nRBC: 0 % (ref 0.0–0.2)

## 2024-09-21 LAB — COMPREHENSIVE METABOLIC PANEL WITH GFR
ALT: 18 U/L (ref 0–44)
AST: 17 U/L (ref 15–41)
Albumin: 3.6 g/dL (ref 3.5–5.0)
Alkaline Phosphatase: 45 U/L (ref 38–126)
Anion gap: 9 (ref 5–15)
BUN: 13 mg/dL (ref 6–20)
CO2: 25 mmol/L (ref 22–32)
Calcium: 9 mg/dL (ref 8.9–10.3)
Chloride: 103 mmol/L (ref 98–111)
Creatinine, Ser: 0.81 mg/dL (ref 0.44–1.00)
GFR, Estimated: >60 mL/min (ref >60)
Glucose, Bld: 102 mg/dL — ABNORMAL HIGH (ref 70–99)
Potassium: 4 mmol/L (ref 3.5–5.1)
Sodium: 138 mmol/L (ref 135–145)
Total Bilirubin: 0.3 mg/dL (ref 0.0–1.2)
Total Protein: 8 g/dL (ref 6.5–8.1)

## 2024-09-21 LAB — URINALYSIS, ROUTINE W REFLEX MICROSCOPIC
Bilirubin Urine: NEGATIVE
Glucose, UA: NEGATIVE mg/dL
Hgb urine dipstick: NEGATIVE
Ketones, ur: NEGATIVE mg/dL
Leukocytes,Ua: NEGATIVE
Nitrite: NEGATIVE
Protein, ur: 100 mg/dL — AB
Specific Gravity, Urine: 1.031 — ABNORMAL HIGH (ref 1.005–1.030)
pH: 5 (ref 5.0–8.0)

## 2024-09-21 LAB — CBC
HCT: 26.7 % — ABNORMAL LOW (ref 36.0–46.0)
Hemoglobin: 7.5 g/dL — ABNORMAL LOW (ref 12.0–15.0)
MCH: 22.1 pg — ABNORMAL LOW (ref 26.0–34.0)
MCHC: 28.1 g/dL — ABNORMAL LOW (ref 30.0–36.0)
MCV: 78.8 fL — ABNORMAL LOW (ref 80.0–100.0)
Platelets: 296 K/uL (ref 150–400)
RBC: 3.39 MIL/uL — ABNORMAL LOW (ref 3.87–5.11)
RDW: 17.9 % — ABNORMAL HIGH (ref 11.5–15.5)
WBC: 8.1 K/uL (ref 4.0–10.5)
nRBC: 0 % (ref 0.0–0.2)

## 2024-09-21 LAB — POC URINE PREG, ED: Preg Test, Ur: NEGATIVE

## 2024-09-21 LAB — TROPONIN T, HIGH SENSITIVITY
Troponin T High Sensitivity: <15 ng/L (ref 0–19)
Troponin T High Sensitivity: <15 ng/L (ref 0–19)

## 2024-09-21 LAB — PREPARE RBC (CROSSMATCH)

## 2024-09-21 LAB — PREGNANCY, URINE: Preg Test, Ur: NEGATIVE

## 2024-09-21 LAB — HCG, QUANTITATIVE, PREGNANCY: hCG, Beta Chain, Quant, S: <1 m[IU]/mL (ref <5)

## 2024-09-21 MED ORDER — ACETAMINOPHEN 500 MG PO TABS
1000.0000 mg | ORAL_TABLET | Freq: Once | ORAL | Status: AC
  Administered 2024-09-21: 1000 mg via ORAL
  Filled 2024-09-21: qty 2

## 2024-09-21 MED ORDER — SODIUM CHLORIDE 0.9% IV SOLUTION
Freq: Once | INTRAVENOUS | Status: AC
  Filled 2024-09-21: qty 250

## 2024-09-21 NOTE — ED Notes (Signed)
 Pt taken to CT.

## 2024-09-21 NOTE — ED Notes (Signed)
 Pt pants and flip flops placed in belongings bag, family at bedside.

## 2024-09-21 NOTE — ED Notes (Addendum)
 Pt stating bleeding started this morning, states there was a lot of blood in the toilet, unsure of source. Pt states takes provera  contraceptive. Stating about a month ago was last sexual encounter. Denies any previous pregnancies, has no concerns of pregnancy at this time. Pt states was sitting on toilet when she felt off and passed out. Denies any CP when passing out. Denies any blood thinners.

## 2024-09-21 NOTE — ED Provider Notes (Signed)
 [facility] Provider Note    Event Date/Time   First MD Initiated Contact with Patient 09/21/24 [phone]     (approximate)   History   Vaginal Bleeding   HPI  Sabrina Bell is a 23 y.o. female with history of menorrhagia, on progesterone, morbid obesity, iron  deficient anemia who comes in with vaginal bleeding.  Patient reports being compliant with her progesterone.  She reports irregular periods.  She reports last being sexually active 1 month ago.  She reports having gone to the bathroom having significant vaginal bleeding and when she was on the toilet she reports having a syncopal episode where she struck her head on the bathtub.  She does report LOC.  She denies any other chest pain, new shortness of breath, abdominal pain.  I reviewed a note on 09/08/2024 where patient was noted to have iron  deficient anemia with a hemoglobin of 7.7 they recommend her go to the ER for evaluation.  She has not tolerated oral iron  well due to GI side effects.    Physical Exam   Triage Vital Signs: ED Triage Vitals  Encounter Vitals Group     BP 09/21/24 0847 103/73     Girls Systolic BP Percentile --      Girls Diastolic BP Percentile --      Boys Systolic BP Percentile --      Boys Diastolic BP Percentile --      Pulse Rate 09/21/24 0847 90     Resp 09/21/24 0847 20     Temp --      Temp Source 09/21/24 0847 Oral     SpO2 09/21/24 0847 100 %     Weight 09/21/24 0848 (!) 580 lb (263.1 kg)     Height 09/21/24 0848 5' 7 (1.702 m)     Head Circumference --      Peak Flow --      Pain Score 09/21/24 0848 8     Pain Loc --      Pain Education --      Exclude from Growth Chart --     Most recent vital signs: Vitals:   09/21/24 0847  BP: 103/73  Pulse: 90  Resp: 20  SpO2: 100%     General: Awake, no distress.  Elevated BMI. CV:  Good peripheral perfusion.  Resp:  Normal effort.  Abd:  No distention.  Soft and nontender scattered wounds that are healing  noted on abdomen. Other:  Vaginal exam with finger insertion without any active bleeding.  Rectal exam had brown stool. Hematoma noted to forehead.  ED Results / Procedures / Treatments   Labs (all labs ordered are listed, but only abnormal results are displayed) Labs Reviewed  CBC WITH DIFFERENTIAL/PLATELET - Abnormal; Notable for the following components:      Result Value   RBC 3.81 (*)    Hemoglobin 8.2 (*)    HCT 30.1 (*)    MCV 79.0 (*)    MCH 21.5 (*)    MCHC 27.2 (*)    RDW 18.3 (*)    All other components within normal limits  COMPREHENSIVE METABOLIC PANEL WITH GFR - Abnormal; Notable for the following components:   Glucose, Bld 102 (*)    All other components within normal limits  HCG, QUANTITATIVE, PREGNANCY  URINALYSIS, ROUTINE W REFLEX MICROSCOPIC  PREGNANCY, URINE  POC URINE PREG, ED  TYPE AND SCREEN  TROPONIN T, HIGH SENSITIVITY  TROPONIN T, HIGH SENSITIVITY  EKG  My interpretation of EKG:  Normal sinus rate of 89 without any ST elevation or T wave inversions, QTc of 498    Repeat EKG is sinus rate of 89 without any ST elevation or T wave inversions with QTc 452 RADIOLOGY I have reviewed the ct personally and interpreted no intra cranial hemorrhage   PROCEDURES:  Critical Care performed: No  .1-3 Lead EKG Interpretation  Performed by: Ernest Ronal BRAVO, MD Authorized by: Ernest Ronal BRAVO, MD     Interpretation: normal     ECG rate:  70   ECG rate assessment: normal     Rhythm: sinus rhythm     Ectopy: none     Conduction: normal   .Critical Care  Performed by: Ernest Ronal BRAVO, MD Authorized by: Ernest Ronal BRAVO, MD   Critical care provider statement:    Critical care time (minutes):  30   Critical care was necessary to treat or prevent imminent or life-threatening deterioration of the following conditions: symptomatic anemia.   Critical care was time spent personally by me on the following activities:  Development of treatment plan with patient  or surrogate, discussions with consultants, evaluation of patient's response to treatment, examination of patient, ordering and review of laboratory studies, ordering and review of radiographic studies, ordering and performing treatments and interventions, pulse oximetry, re-evaluation of patient's condition and review of old charts    MEDICATIONS ORDERED IN ED: Medications  0.9 %  sodium chloride  infusion (Manually program via Guardrails IV Fluids) (0 mLs Intravenous Paused 09/21/24 1309)  acetaminophen  (TYLENOL ) tablet 1,000 mg (1,000 mg Oral Given 09/21/24 1243)     IMPRESSION / MDM / ASSESSMENT AND PLAN / ED COURSE  I reviewed the triage vital signs and the nursing notes.   Patient's presentation is most consistent with acute presentation with potential threat to life or bodily function.   Patient comes in with vaginal bleeding.  Workup was done to evaluate for anemia.  Suspect syncopal episode is related to vasovagal from the bleeding and being on the toilet.  CT imaging ordered evaluate for intercranial hemorrhage, cervical fracture.  Will keep patient on the cardiac monitor and evaluate for cardiac cause.  Initial EKG shows slightly prolonged QTc but I did recheck it and it is normal at 450 do not feel that this represents prolonged QTc other emergent cardiac pathology.  Present test was negative so suspicion for ectopic pregnancy and she has got no lower abdominal pain to suggest ovarian torsion or ovarian cyst rupture.  Patient had issues with vaginal breathing bleeding previously she had an ultrasound done in 2021 that was limited visualization but normal-appearing uterus and in 2022 they noted some questionable thickening of the endometrium.  She is followed by [facility] for this.  Given her elevated BMI speculum exam would be a little bit difficult therefore I did do a finger insertion and I did not see any evidence of active bleeding.  I did also do a rectal exam just to ensure it was not  coming from a different area and this was also with brown stool.  Her troponins here are negative x 2.  Pregnancy test was negative.  CBC shows hemoglobin of 8.2 which is around patient's baseline.  Platelets were unable to be visualized so therefore will repeat her CBC.  CMP was reassuring patient is a positive  Repeat hemoglobin is downtrending to 7.5 I suspect that this represents the blood that she had earlier today.  She has had no  additional bleeding here.  She has gone to the bathroom without any bleeding.  We discussed pros and cons of blood and we will transfuse 1 unit due to concern for syncopal episode with anemia.  She does report that she is currently on Provera  30 mg daily and she had been doing well on this until she had the episode of bleeding today.  She was previously with [facility] but she has not followed up with them secondary to being lost when they became [facility].  Therefore will reach out to [facility] to see if they want to make any other additional adjustments to medications, and to help facilitate close follow-up.  Given her iron  deficient anemia I will refer her to hematology.  We discussed doing a pelvic exam to ensure no bleeding but patient reports having go to the bathroom and she has had no vaginal bleeding with wiping and given the difficulties with potentially doing this exam she also would like to hold off on pelvic exam.    1:25 PM discussed case with OB/GYN who agreed with close follow-up with her outpatient and they would send their schedulers a message to try to get her seen.  Given her bleeding has already stopped they did not want to start her on any additional medications but if it continues to happen they can consider TXA as a second line but if the bleeding comes back they would prefer her to come back in for recheck hemoglobin and potential discussion of starting TXA at that point otherwise we will follow-up with her outpatient.   Patient is been  here for over 5 hours without any recurrent vaginal bleeding.  Her repeat abdominal exam remains soft and nontender so I have low suspicion for ovarian pathology and again she wanted to hold off on ultrasound and will follow-up outpatient with OB/GYN.    The patient is on the cardiac monitor to evaluate for evidence of arrhythmia and/or significant heart rate changes.      FINAL CLINICAL IMPRESSION(S) / ED DIAGNOSES   Final diagnoses:  Symptomatic anemia  Vaginal bleeding     Rx / DC Orders   ED Discharge Orders          Ordered    Ambulatory referral to Hematology / Oncology        09/21/24 1314             Note:  This document was prepared using Dragon voice recognition software and may include unintentional dictation errors.   Ernest Ronal BRAVO, MD 09/21/24 [phone]

## 2024-09-21 NOTE — Discharge Instructions (Addendum)
 I have given you 1 unit of blood to help with your low blood levels but given your bleeding had stopped we have opted to feel comfortable with discharge home.  If the bleeding returns you should return to the ER for recheck of hemoglobin.  Otherwise you can follow-up with OB/GYN to discuss your episodes of heavy bleeding.  Have also placed a referral for hematology to discuss iron  transfusions or blood transfusions in the future if needed.  Return for abdominal pain, fevers, lightheadedness or any other concerns.

## 2024-09-21 NOTE — ED Triage Notes (Signed)
 Pt BIB [facility] from home for vag bleed, EMS reports not sure if related to cycle, had a lot of bleeding this morning. Pt went to stand up this morning, fell and passed out, hit forehead on tub 8/10 pain. EMS stating pt did loose consuciousness, very dizzy when stood up.  vitals WNLL 148/56, 98.4, cbg 1263, 95RA, 100 HR AOx4

## 2024-09-22 LAB — BPAM RBC
Blood Product Expiration Date: 202512062359
ISSUE DATE / TIME: 202512021253
Unit Type and Rh: 6200

## 2024-09-22 LAB — TYPE AND SCREEN
ABO/RH(D): A POS
Antibody Screen: NEGATIVE
Unit division: 0

## 2024-09-23 DIAGNOSIS — N921 Excessive and frequent menstruation with irregular cycle: Secondary | ICD-10-CM | POA: Diagnosis not present

## 2024-09-23 DIAGNOSIS — D500 Iron deficiency anemia secondary to blood loss (chronic): Secondary | ICD-10-CM | POA: Diagnosis not present

## 2024-09-23 DIAGNOSIS — R809 Proteinuria, unspecified: Secondary | ICD-10-CM | POA: Diagnosis not present

## 2024-10-07 ENCOUNTER — Telehealth: Payer: Self-pay | Admitting: Oncology

## 2024-10-07 ENCOUNTER — Inpatient Hospital Stay: Admitting: Oncology

## 2024-10-07 ENCOUNTER — Inpatient Hospital Stay

## 2024-10-07 NOTE — Telephone Encounter (Signed)
 Pt missed her scheduled appt on 12/18 so I called the pt to see if she wants to rs, no answer so I left her a vm to return my call so we can get her rs.
